# Patient Record
Sex: Female | Born: 1980 | Race: Asian | Hispanic: No | Marital: Married | Smoking: Never smoker
Health system: Southern US, Community
[De-identification: ages and names within clinical notes are randomized; demographics above are authoritative.]

## PROBLEM LIST (undated history)

## (undated) DIAGNOSIS — M51369 Other intervertebral disc degeneration, lumbar region without mention of lumbar back pain or lower extremity pain: Secondary | ICD-10-CM

## (undated) DIAGNOSIS — M5136 Other intervertebral disc degeneration, lumbar region: Secondary | ICD-10-CM

## (undated) DIAGNOSIS — Z8759 Personal history of other complications of pregnancy, childbirth and the puerperium: Secondary | ICD-10-CM

## (undated) HISTORY — PX: DILATION AND CURETTAGE OF UTERUS: SHX78

---

## 2008-11-09 ENCOUNTER — Emergency Department (HOSPITAL_COMMUNITY): Admission: EM | Admit: 2008-11-09 | Discharge: 2008-11-09 | Payer: Self-pay | Admitting: Emergency Medicine

## 2010-08-20 IMAGING — CR DG HAND COMPLETE 3+V*L*
4 series · 4 of 4 positions shown · non-contrast
Comparison: None available.

CLINICAL DATA: Left thumb smashed in door today.

LEFT HAND - COMPLETE 3+ VIEW

[x hand ap left]
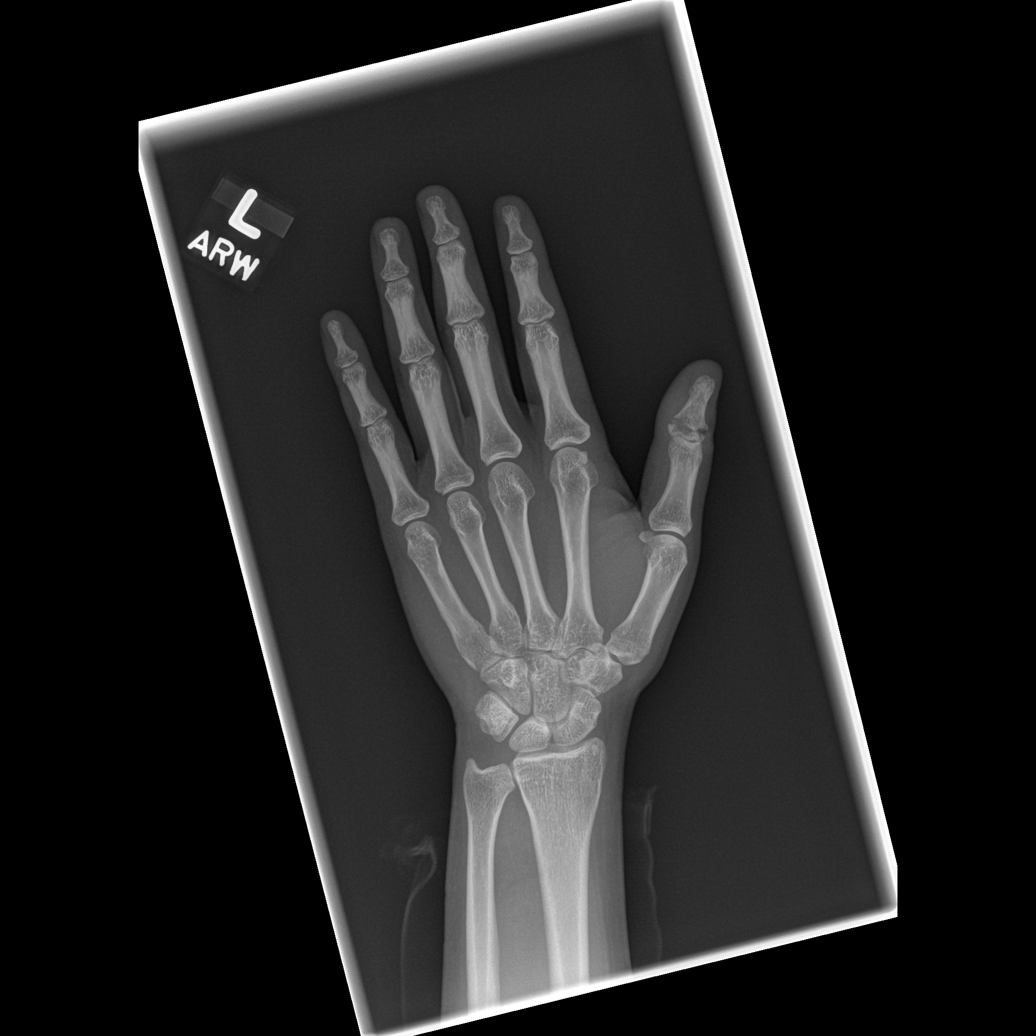

[x hand oblique left]
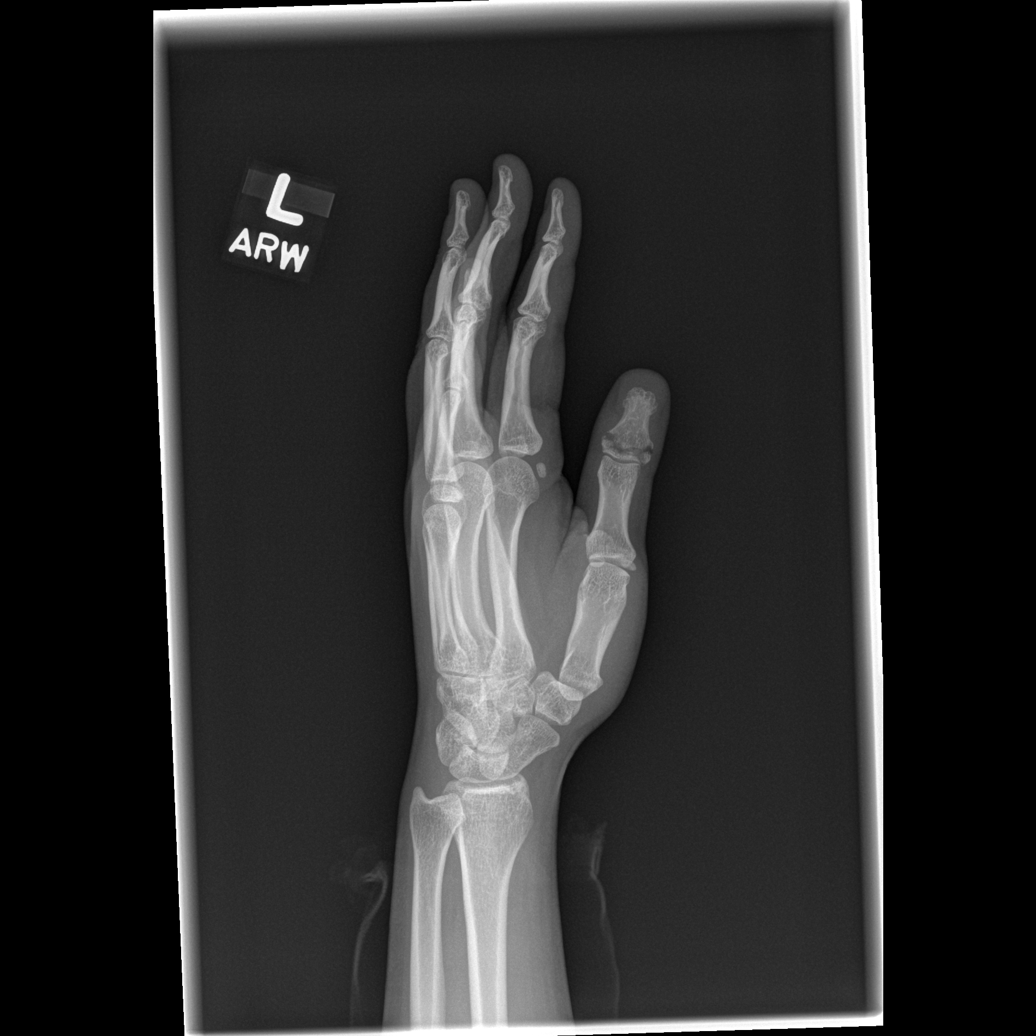

[x hand lat left (1 of 2)]
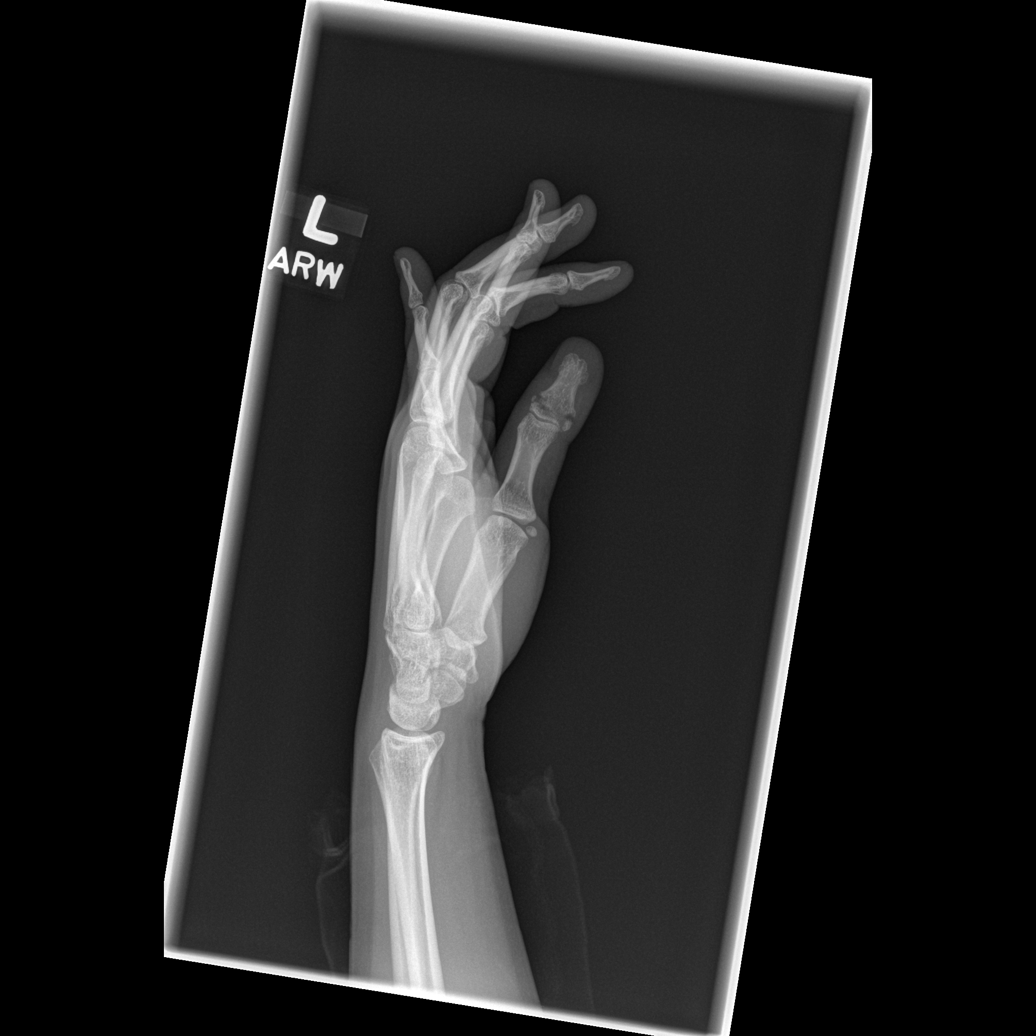

[x hand lat left (2 of 2)]
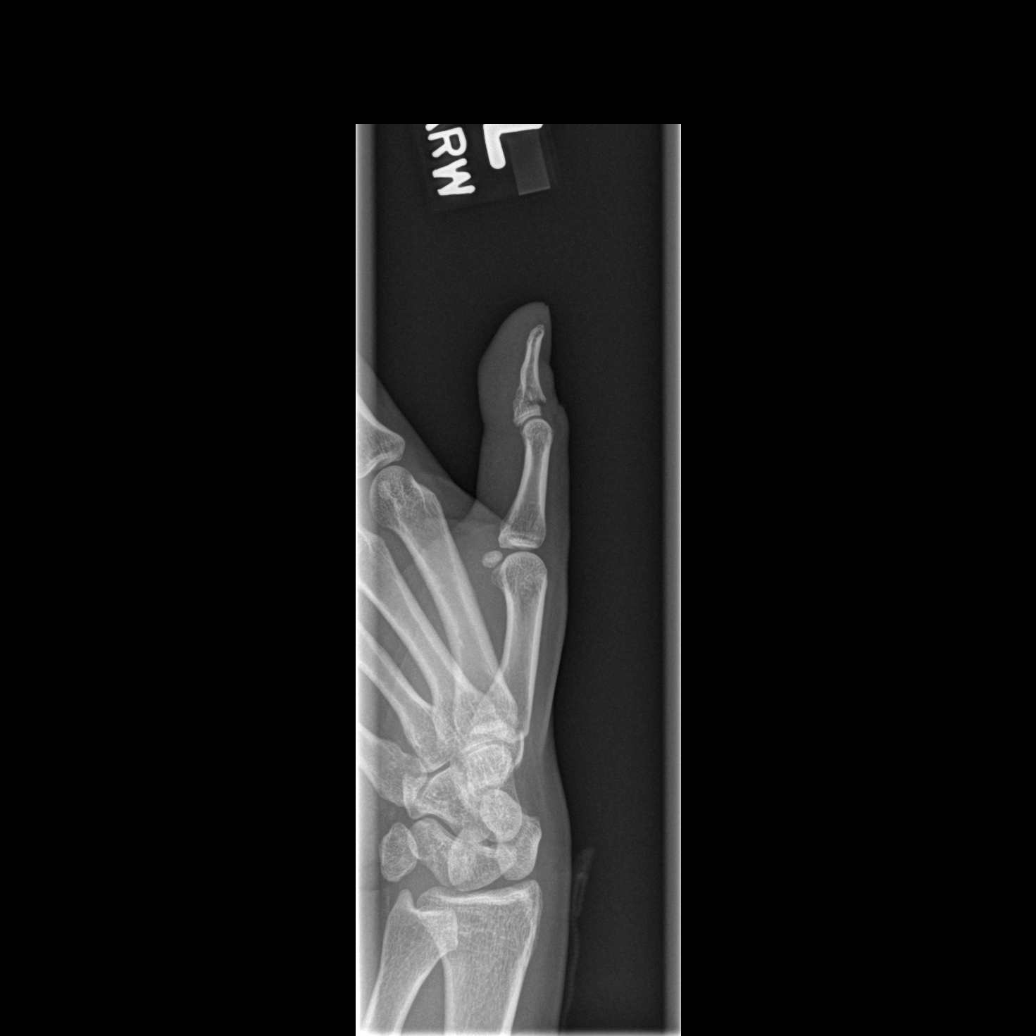

[4 of 4 positions shown; findings below may reference images not displayed]

FINDINGS: There is a comminuted fracture of the base of the distal
phalanx in the right.  This appears to extend to the joint surface
along the radial aspect of the film.  The distal fracture fragment
is displaced posteriorly by just over 1 mm.  There is marked soft
tissue swelling associated.   A soft tissue laceration is evident.
IMPRESSION: 1.  Comminuted fracture at the base of the distal phalanx of the
thumb is displaced posteriorly by approximately 1 mm.
2.  Marked soft tissue swelling and dorsal laceration without
radiopaque foreign body.

## 2012-03-28 ENCOUNTER — Ambulatory Visit: Payer: Self-pay | Admitting: Family Medicine

## 2015-02-07 NOTE — L&D Delivery Note (Signed)
Operative Delivery Note At 3:09 PM a viable and healthy female was delivered via Vaginal, Vacuum Investment banker, operational(Extractor).  Presentation: vertex; Position: Left,, Occiput,, Anterior; Station: +3.  Verbal consent: obtained from family.  Risks and benefits discussed in detail.  Risks include, but are not limited to the risks of anesthesia, bleeding, infection, damage to maternal tissues, fetal cephalhematoma.  There is also the risk of inability to effect vaginal delivery of the head, or shoulder dystocia that cannot be resolved by established maneuvers, leading to the need for emergency cesarean section. Non reassuring FHT noted.   APGAR: 3, 7; weight 6 lb 0.3 oz (2730 g).   Placenta status: spontaneous, intact.   Cord:  with the following complications: ? Short cord.  Cord pH: pending  Anesthesia:  epidural Instruments: kiwi x 2 pulls, one pop off Episiotomy: Median Lacerations:  none Suture Repair: 2.0 vicryl rapide 0 vicryl Est. Blood Loss (mL): 150  Mom to postpartum.  Baby to Couplet care / Skin to Skin.  Sandy Gilmore J 11/08/2015, 5:26 PM

## 2015-04-08 LAB — OB RESULTS CONSOLE GBS: STREP GROUP B AG: NEGATIVE

## 2015-04-22 LAB — OB RESULTS CONSOLE RPR: RPR: NONREACTIVE

## 2015-04-22 LAB — OB RESULTS CONSOLE ABO/RH: RH Type: POSITIVE

## 2015-04-22 LAB — OB RESULTS CONSOLE RUBELLA ANTIBODY, IGM: Rubella: IMMUNE

## 2015-04-22 LAB — OB RESULTS CONSOLE HIV ANTIBODY (ROUTINE TESTING): HIV: NONREACTIVE

## 2015-04-22 LAB — OB RESULTS CONSOLE HEPATITIS B SURFACE ANTIGEN: Hepatitis B Surface Ag: NEGATIVE

## 2015-05-10 LAB — OB RESULTS CONSOLE GC/CHLAMYDIA
Chlamydia: NEGATIVE
Gonorrhea: NEGATIVE

## 2015-11-08 ENCOUNTER — Inpatient Hospital Stay (HOSPITAL_COMMUNITY): Payer: BLUE CROSS/BLUE SHIELD | Admitting: Anesthesiology

## 2015-11-08 ENCOUNTER — Inpatient Hospital Stay (HOSPITAL_COMMUNITY)
Admission: AD | Admit: 2015-11-08 | Discharge: 2015-11-10 | DRG: 775 | Disposition: A | Discharge status: Home / Self Care | Payer: BLUE CROSS/BLUE SHIELD | Source: Ambulatory Visit | Attending: Obstetrics and Gynecology | Admitting: Obstetrics and Gynecology

## 2015-11-08 ENCOUNTER — Encounter (HOSPITAL_COMMUNITY): Payer: Self-pay

## 2015-11-08 DIAGNOSIS — Z3A37 37 weeks gestation of pregnancy: Secondary | ICD-10-CM

## 2015-11-08 DIAGNOSIS — Z3403 Encounter for supervision of normal first pregnancy, third trimester: Secondary | ICD-10-CM | POA: Diagnosis present

## 2015-11-08 DIAGNOSIS — Z8759 Personal history of other complications of pregnancy, childbirth and the puerperium: Secondary | ICD-10-CM

## 2015-11-08 HISTORY — DX: Personal history of other complications of pregnancy, childbirth and the puerperium: Z87.59

## 2015-11-08 HISTORY — DX: Other intervertebral disc degeneration, lumbar region without mention of lumbar back pain or lower extremity pain: M51.369

## 2015-11-08 HISTORY — DX: Other intervertebral disc degeneration, lumbar region: M51.36

## 2015-11-08 LAB — CBC
HCT: 40.9 % (ref 36.0–46.0)
Hemoglobin: 14.4 g/dL (ref 12.0–15.0)
MCH: 33.3 pg (ref 26.0–34.0)
MCHC: 35.2 g/dL (ref 30.0–36.0)
MCV: 94.5 fL (ref 78.0–100.0)
PLATELETS: 182 10*3/uL (ref 150–400)
RBC: 4.33 MIL/uL (ref 3.87–5.11)
RDW: 13.4 % (ref 11.5–15.5)
WBC: 12.1 10*3/uL — ABNORMAL HIGH (ref 4.0–10.5)

## 2015-11-08 LAB — TYPE AND SCREEN
ABO/RH(D): B POS
Antibody Screen: NEGATIVE

## 2015-11-08 LAB — ABO/RH: ABO/RH(D): B POS

## 2015-11-08 MED ORDER — SENNOSIDES-DOCUSATE SODIUM 8.6-50 MG PO TABS
2.0000 | ORAL_TABLET | ORAL | Status: DC
  Administered 2015-11-08 – 2015-11-10 (×2): 2 via ORAL
  Filled 2015-11-08 (×2): qty 2

## 2015-11-08 MED ORDER — OXYTOCIN BOLUS FROM INFUSION
500.0000 mL | Freq: Once | INTRAVENOUS | Status: AC
  Administered 2015-11-08: 500 mL via INTRAVENOUS

## 2015-11-08 MED ORDER — LIDOCAINE HCL (PF) 1 % IJ SOLN
INTRAMUSCULAR | Status: DC | PRN
  Administered 2015-11-08 (×2): 5 mL via EPIDURAL

## 2015-11-08 MED ORDER — OXYCODONE-ACETAMINOPHEN 5-325 MG PO TABS: 2.0000 | ORAL_TABLET | ORAL | Status: DC | PRN

## 2015-11-08 MED ORDER — ZOLPIDEM TARTRATE 5 MG PO TABS
5.0000 mg | ORAL_TABLET | Freq: Every evening | ORAL | Status: DC | PRN
Start: 2015-11-08 — End: 2015-11-10

## 2015-11-08 MED ORDER — PHENYLEPHRINE 40 MCG/ML (10ML) SYRINGE FOR IV PUSH (FOR BLOOD PRESSURE SUPPORT)
80.0000 ug | PREFILLED_SYRINGE | INTRAVENOUS | Status: DC | PRN
  Administered 2015-11-08: 80 ug via INTRAVENOUS

## 2015-11-08 MED ORDER — COCONUT OIL OIL
1.0000 "application " | TOPICAL_OIL | Status: DC | PRN
  Administered 2015-11-09: 1 via TOPICAL
  Filled 2015-11-08: qty 120

## 2015-11-08 MED ORDER — LACTATED RINGERS IV SOLN
INTRAVENOUS | Status: DC
  Administered 2015-11-08 (×2): via INTRAVENOUS

## 2015-11-08 MED ORDER — ONDANSETRON HCL 4 MG PO TABS: 4.0000 mg | ORAL_TABLET | ORAL | Status: DC | PRN

## 2015-11-08 MED ORDER — DIBUCAINE 1 % RE OINT: 1.0000 "application " | TOPICAL_OINTMENT | RECTAL | Status: DC | PRN

## 2015-11-08 MED ORDER — TETANUS-DIPHTH-ACELL PERTUSSIS 5-2.5-18.5 LF-MCG/0.5 IM SUSP: 0.5000 mL | Freq: Once | INTRAMUSCULAR | Status: DC

## 2015-11-08 MED ORDER — EPHEDRINE 5 MG/ML INJ: 10.0000 mg | INTRAVENOUS | Status: DC | PRN

## 2015-11-08 MED ORDER — ACETAMINOPHEN 325 MG PO TABS
650.0000 mg | ORAL_TABLET | ORAL | Status: DC | PRN
  Administered 2015-11-09: 650 mg via ORAL
  Filled 2015-11-08: qty 2

## 2015-11-08 MED ORDER — ONDANSETRON HCL 4 MG/2ML IJ SOLN: 4.0000 mg | INTRAMUSCULAR | Status: DC | PRN

## 2015-11-08 MED ORDER — WITCH HAZEL-GLYCERIN EX PADS: 1.0000 "application " | MEDICATED_PAD | CUTANEOUS | Status: DC | PRN

## 2015-11-08 MED ORDER — FENTANYL 2.5 MCG/ML BUPIVACAINE 1/10 % EPIDURAL INFUSION (WH - ANES)
14.0000 mL/h | INTRAMUSCULAR | Status: DC | PRN
  Administered 2015-11-08: 14 mL/h via EPIDURAL
  Filled 2015-11-08: qty 125

## 2015-11-08 MED ORDER — LACTATED RINGERS IV SOLN: 500.0000 mL | INTRAVENOUS | Status: DC | PRN

## 2015-11-08 MED ORDER — ONDANSETRON HCL 4 MG/2ML IJ SOLN: 4.0000 mg | Freq: Four times a day (QID) | INTRAMUSCULAR | Status: DC | PRN

## 2015-11-08 MED ORDER — IBUPROFEN 600 MG PO TABS
600.0000 mg | ORAL_TABLET | Freq: Four times a day (QID) | ORAL | Status: DC
  Administered 2015-11-08 – 2015-11-10 (×9): 600 mg via ORAL
  Filled 2015-11-08 (×9): qty 1

## 2015-11-08 MED ORDER — PHENYLEPHRINE 40 MCG/ML (10ML) SYRINGE FOR IV PUSH (FOR BLOOD PRESSURE SUPPORT)
80.0000 ug | PREFILLED_SYRINGE | INTRAVENOUS | Status: DC | PRN
  Filled 2015-11-08: qty 10

## 2015-11-08 MED ORDER — DIPHENHYDRAMINE HCL 50 MG/ML IJ SOLN: 12.5000 mg | INTRAMUSCULAR | Status: DC | PRN

## 2015-11-08 MED ORDER — SOD CITRATE-CITRIC ACID 500-334 MG/5ML PO SOLN: 30.0000 mL | ORAL | Status: DC | PRN

## 2015-11-08 MED ORDER — OXYCODONE-ACETAMINOPHEN 5-325 MG PO TABS: 1.0000 | ORAL_TABLET | ORAL | Status: DC | PRN

## 2015-11-08 MED ORDER — PRENATAL MULTIVITAMIN CH
1.0000 | ORAL_TABLET | Freq: Every day | ORAL | Status: DC
  Administered 2015-11-09 – 2015-11-10 (×2): 1 via ORAL
  Filled 2015-11-08 (×2): qty 1

## 2015-11-08 MED ORDER — LACTATED RINGERS IV SOLN: 500.0000 mL | Freq: Once | INTRAVENOUS | Status: DC

## 2015-11-08 MED ORDER — OXYTOCIN 40 UNITS IN LACTATED RINGERS INFUSION - SIMPLE MED
2.5000 [IU]/h | INTRAVENOUS | Status: DC
  Filled 2015-11-08: qty 1000

## 2015-11-08 MED ORDER — DIPHENHYDRAMINE HCL 25 MG PO CAPS: 25.0000 mg | ORAL_CAPSULE | Freq: Four times a day (QID) | ORAL | Status: DC | PRN

## 2015-11-08 MED ORDER — OXYCODONE-ACETAMINOPHEN 5-325 MG PO TABS
1.0000 | ORAL_TABLET | ORAL | Status: DC | PRN
  Administered 2015-11-09: 1 via ORAL
  Filled 2015-11-08: qty 1

## 2015-11-08 MED ORDER — FLEET ENEMA 7-19 GM/118ML RE ENEM: 1.0000 | ENEMA | RECTAL | Status: DC | PRN

## 2015-11-08 MED ORDER — METHYLERGONOVINE MALEATE 0.2 MG PO TABS: 0.2000 mg | ORAL_TABLET | ORAL | Status: DC | PRN

## 2015-11-08 MED ORDER — LIDOCAINE HCL (PF) 1 % IJ SOLN
30.0000 mL | INTRAMUSCULAR | Status: DC | PRN
  Filled 2015-11-08: qty 30

## 2015-11-08 MED ORDER — BENZOCAINE-MENTHOL 20-0.5 % EX AERO
1.0000 "application " | INHALATION_SPRAY | CUTANEOUS | Status: DC | PRN
  Administered 2015-11-09: 1 via TOPICAL
  Filled 2015-11-08: qty 56

## 2015-11-08 MED ORDER — METHYLERGONOVINE MALEATE 0.2 MG/ML IJ SOLN: 0.2000 mg | INTRAMUSCULAR | Status: DC | PRN

## 2015-11-08 MED ORDER — ACETAMINOPHEN 325 MG PO TABS: 650.0000 mg | ORAL_TABLET | ORAL | Status: DC | PRN

## 2015-11-08 MED ORDER — SIMETHICONE 80 MG PO CHEW: 80.0000 mg | CHEWABLE_TABLET | ORAL | Status: DC | PRN

## 2015-11-08 NOTE — Progress Notes (Signed)
Dr Billy Coasttaavon called and notified of prolonged and late decelerations with laboring down and as well as pushing.  Notified pt feels some pressure with uc's and baby has moved some during laboring down, but minimal movement with pushing.  md will evaluate tracing remotely and will continue pushing

## 2015-11-08 NOTE — Progress Notes (Signed)
   11/08/15 1845  Epidural/Spinal Function  Cutaneous sensation Normal sensation  Patient able to flex knees Yes  Patient able to lift hips off bed Yes  Back pain beyond tenderness at insertion site No  Progressively worsening motor and/or sensory loss No  Bowel and/or bladder incontinence post epidural No  pt states hx of dizziness and fall after previous endoscopy 2 yrs ago. States she has hx of prolonged dizziness after anesthesia. Dizziness when sitting on side of bed, I & O cath with 800 output instead of Stedy to bathroom.

## 2015-11-08 NOTE — Anesthesia Procedure Notes (Signed)
Epidural Patient location during procedure: OB Start time: 11/08/2015 11:20 AM End time: 11/08/2015 11:24 AM  Staffing Anesthesiologist: Leilani AbleHATCHETT, Windel Keziah Performed: anesthesiologist   Preanesthetic Checklist Completed: patient identified, surgical consent, pre-op evaluation, timeout performed, IV checked, risks and benefits discussed and monitors and equipment checked  Epidural Patient position: sitting Prep: site prepped and draped and DuraPrep Patient monitoring: continuous pulse ox and blood pressure Approach: midline Location: L3-L4 Injection technique: LOR air  Needle:  Needle type: Tuohy  Needle gauge: 17 G Needle length: 9 cm and 9 Needle insertion depth: 5 cm (3) cm Catheter type: closed end flexible Catheter size: 19 Gauge Catheter at skin depth: 8 cm Test dose: negative and Other  Assessment Sensory level: T9 Events: blood not aspirated, injection not painful, no injection resistance, negative IV test and no paresthesia  Additional Notes Reason for block:procedure for pain

## 2015-11-08 NOTE — H&P (Signed)
Sandy Gilmore is a 35 y.o. female presenting for labor. History of advanced dilatation. RN notes cervical change.  OB History    Gravida Para Term Preterm AB Living   3 0 0 0 2 0   SAB TAB Ectopic Multiple Live Births   1 1 0 0 0     Past Medical History:  Diagnosis Date  . Degenerative disc disease, lumbar    Past Surgical History:  Procedure Laterality Date  . DILATION AND CURETTAGE OF UTERUS     Family History: family history is not on file. Social History:  reports that she has never smoked. She has never used smokeless tobacco. She reports that she does not drink alcohol or use drugs.     Maternal Diabetes: No Genetic Screening: Normal Maternal Ultrasounds/Referrals: Normal Fetal Ultrasounds or other Referrals:  None Maternal Substance Abuse:  No Significant Maternal Medications:  None Significant Maternal Lab Results:  None Other Comments:  None  Review of Systems  Constitutional: Negative.   All other systems reviewed and are negative.  Maternal Medical History:  Reason for admission: Contractions.   Contractions: Onset was less than 1 hour ago.   Perceived severity is moderate.    Fetal activity: Perceived fetal activity is normal.   Last perceived fetal movement was within the past hour.    Prenatal complications: no prenatal complications Prenatal Complications - Diabetes: none.    Dilation: 5.5 Effacement (%): 100 Station: -1 Exam by:: Sandy Oldee Carter RN Blood pressure 123/77, pulse 73, temperature 98 F (36.7 C), temperature source Oral, resp. rate 18, height 4\' 11"  (1.499 m), weight 51.3 kg (113 lb), SpO2 100 %. Maternal Exam:  Uterine Assessment: Contraction strength is moderate.  Contraction frequency is regular.   Abdomen: Patient reports no abdominal tenderness. Fetal presentation: vertex  Introitus: Normal vulva. Normal vagina.  Ferning test: not done.  Nitrazine test: not done. Amniotic fluid character: not assessed.  Pelvis: adequate for  delivery.   Cervix: Cervix evaluated by digital exam.     Physical Exam  Nursing note and vitals reviewed. Constitutional: She is oriented to person, place, and time.  HENT:  Head: Normocephalic and atraumatic.  Neck: Normal range of motion. Neck supple.  Cardiovascular: Normal rate and regular rhythm.   Respiratory: Effort normal and breath sounds normal.  GI: Soft. Bowel sounds are normal.  Genitourinary: Vagina normal and uterus normal. Rectal exam shows guaiac negative stool.  Musculoskeletal: Normal range of motion.  Neurological: She is alert and oriented to person, place, and time. She has normal reflexes.  Skin: Skin is warm and dry.  Psychiatric: She has a normal mood and affect.    Prenatal labs: ABO, Rh: B/Positive/-- (03/16 0000) Antibody:   Rubella: Immune (03/16 0000) RPR: Nonreactive (03/16 0000)  HBsAg: Negative (03/16 0000)  HIV: Non-reactive (03/16 0000)  GBS: Negative (03/02 0000)   Assessment/Plan: Term IUP Active labor   Sandy Gilmore 11/08/2015, 10:32 AM

## 2015-11-08 NOTE — Anesthesia Preprocedure Evaluation (Signed)

## 2015-11-08 NOTE — Progress Notes (Signed)
Carroll SageQian Langhorne is a 35 y.o. G3P0020 at 3723w0d by LMP admitted for active labor  Subjective: More comfortable.  Objective: BP (!) 118/103   Pulse (!) 102   Temp 98 F (36.7 C) (Oral)   Resp 16   Ht 4\' 11"  (1.499 m)   Wt 51.3 kg (113 lb)   SpO2 100%   BMI 22.82 kg/m  No intake/output data recorded. No intake/output data recorded.  FHT:  FHR: 115 bpm, variability: moderate,  accelerations:  Present,  decelerations:  Absent and   UC:   regular, every 3 minutes SVE:   7-8/100/+1  Labs: Lab Results  Component Value Date   WBC 12.1 (H) 11/08/2015   HGB 14.4 11/08/2015   HCT 40.9 11/08/2015   MCV 94.5 11/08/2015   PLT 182 11/08/2015    Assessment / Plan: Spontaneous labor, progressing normally  Labor: Progressing normally Preeclampsia:  no signs or symptoms of toxicity Fetal Wellbeing:  Category I Pain Control:  Epidural I/D:  n/a Anticipated MOD:  NSVD  Alethia Melendrez J 11/08/2015, 11:58 AM

## 2015-11-08 NOTE — Progress Notes (Signed)
Dr Billy Coasttaavon called and notified of prolonged decels with pushing.  Orders rec to stop pushing and allow baby to recover at this time.  Will resume pushing in 1 hour or sooner if needed.

## 2015-11-08 NOTE — Lactation Note (Signed)
This note was copied from a baby's chart. Lactation Consultation Note  Patient Name: Boy Carroll SageQian Troutman ZOXWR'UToday's Date: 11/08/2015 Reason for consult: Initial assessment Baby at 6 hr of life. Mom is worried because baby is sleepy. She stated that her mother did not have enough milk so she does not think she will. Discussed baby behavior, feeding frequency, baby belly size, voids, wt loss, breast changes, and nipple care. Demonstrated manual expression, colostrum noted bilaterally, spoon in room. Given lactation handouts. Aware of OP services and support group. Baby was sleeping in basinet with clear bubble on his lips, no attempt to latch at this visit. Mom will call for help at next feeding.     Maternal Data Has patient been taught Hand Expression?: Yes Does the patient have breastfeeding experience prior to this delivery?: No  Feeding Feeding Type: Breast Fed Length of feed: 3 min  LATCH Score/Interventions                      Lactation Tools Discussed/Used WIC Program: No   Consult Status Consult Status: Follow-up Date: 11/09/15 Follow-up type: In-patient    Rulon Eisenmengerlizabeth E Coolidge Gossard 11/08/2015, 10:06 PM

## 2015-11-08 NOTE — Anesthesia Postprocedure Evaluation (Signed)
Anesthesia Post Note  Patient: Sandy Gilmore  Procedure(s) Performed: * No procedures listed *  Patient location during evaluation: Mother Baby Anesthesia Type: Epidural Level of consciousness: awake Pain management: satisfactory to patient Vital Signs Assessment: post-procedure vital signs reviewed and stable Respiratory status: spontaneous breathing Cardiovascular status: stable Anesthetic complications: no     Last Vitals:  Vitals:   11/08/15 1730 11/08/15 1845  BP: 105/67 99/64  Pulse: 82 67  Resp: 18 19  Temp: 36.5 C 36.8 C    Last Pain:  Vitals:   11/08/15 1845  TempSrc:   PainSc: 0-No pain   Pain Goal:                 KeyCorpBURGER,Emelina Hinch

## 2015-11-08 NOTE — Anesthesia Rounding Note (Signed)
  CRNA Epidural Rounding Note  Patient: Carroll SageQian Carolan, 35 y.o., female  Patient's current pain level: Pain Score: 5  (11/08/15 1210)  Agreed upon pain management level: Unable to assess.   Epidural intervention: none  Comments: Patient pushing. Per RN adequate epidural level.  [facility] LLCEIGHT,Kenneith Stief 11/08/2015

## 2015-11-08 NOTE — MAU Note (Signed)
Pt reports regular contractions, was 4/100 last week.

## 2015-11-09 LAB — CBC
HCT: 31.1 % — ABNORMAL LOW (ref 36.0–46.0)
Hemoglobin: 10.8 g/dL — ABNORMAL LOW (ref 12.0–15.0)
MCH: 32.9 pg (ref 26.0–34.0)
MCHC: 34.7 g/dL (ref 30.0–36.0)
MCV: 94.8 fL (ref 78.0–100.0)
Platelets: 135 10*3/uL — ABNORMAL LOW (ref 150–400)
RBC: 3.28 MIL/uL — AB (ref 3.87–5.11)
RDW: 13.7 % (ref 11.5–15.5)
WBC: 17.1 10*3/uL — AB (ref 4.0–10.5)

## 2015-11-09 LAB — RPR: RPR Ser Ql: NONREACTIVE

## 2015-11-09 NOTE — Lactation Note (Signed)
This note was copied from a baby's chart. Lactation Consultation Note Baby now 2621 hours old and has had one good feeding this morning at 10:15/ Mom reports he fed for 15 min, the best he has done. Baby has been sleepy at the breast. Attempted to latch baby but he was too sleepy at this time. Discussed supplementing with EBM or formula and mom agreeable. Has late preterm guidelines for amounts to supplement. Reviewed supplementation methods and mom wants to try finger feeding with a syringe. Reviewed with mom. Mom supplemented with 7 ml of Alimentum with my assist. Baby roused up and we attempted to latch him to breast. Took a few sucks then off to sleep. Reviewed feeding cues and encouraged to feed whenever she sees them or at least q 2-3 hours. Encouraged to breast feed first then supplement and then pump to promote milk supply. Mom agreeable to this plan. Has Spectra pump for home. Mom to pump when dad gets back from lunch. Can supplement at next feeding with any Colostrum obtained. No questions at present. To call for assist prn    Patient Name: Sandy Gilmore ZOXWR'UToday's Date: 11/09/2015 Reason for consult: Follow-up assessment (37.0 Sandy Gilmore)   Maternal Data Formula Feeding for Exclusion: No Has patient been taught Hand Expression?: Yes Does the patient have breastfeeding experience prior to this delivery?: No  Feeding Feeding Type: Breast Fed Length of feed: 15 min  LATCH Score/Interventions Latch: Too sleepy or reluctant, no latch achieved, no sucking elicited.  Audible Swallowing: None  Type of Nipple: Everted at rest and after stimulation  Comfort (Breast/Nipple): Soft / non-tender     Hold (Positioning): Assistance needed to correctly position infant at breast and maintain latch. Intervention(s): Breastfeeding basics reviewed;Position options  LATCH Score: 5  Lactation Tools Discussed/Used WIC Program: No Pump Review: Setup, frequency, and cleaning Initiated by:: Sandy Gilmore Date  initiated::  (reviewed late term infant feeding policy, approved by MD)   Consult Status Consult Status: Follow-up Date: 11/10/15 Follow-up type: In-patient    Sandy Gilmore, Sandy Gilmore 11/09/2015, 12:50 PM

## 2015-11-09 NOTE — Progress Notes (Signed)
PPD # 1 SVD Information for the patient's newborn:  Sandy Gilmore, Sandy Gilmore [[id number]][[id number]]  female    breast feeding , baby slow to latch, has tight jaw, biting down, needs posterior tongue stim to elicit sucking reflex./ Circumcision undecided   S:  Reports feeling well             Tolerating po/ No nausea or vomiting             Bleeding is light             Pain controlled with PO meds             Up ad lib / ambulatory / voiding without difficulties        O:  A & O x 3, in no apparent distress              VS:  Vitals:   11/08/15 1730 11/08/15 1845 11/08/15 2216 11/09/15 0610  BP: 105/67 99/64 (!) 98/59 (!) 83/47  Pulse: 82 67 68 (!) 56  Resp: 18 19 18 16   Temp: 97.7 F (36.5 C) 98.3 F (36.8 C) 97.5 F (36.4 C) 97.8 F (36.6 C)  TempSrc:   Oral Oral  SpO2:   100%   Weight:      Height:        LABS:  Recent Labs  11/08/15 1010 11/09/15 0539  WBC 12.1* 17.1*  HGB 14.4 10.8*  HCT 40.9 31.1*  PLT 182 135*    Blood type: --/--/B POS, B POS (10/02 1010)  Rubella: Immune (03/16 0000)   I&O: I/O last 3 completed shifts: In: -  Out: 1750 [Urine:1600; Blood:150]          No intake/output data recorded.  Lungs: Clear and unlabored  Heart: regular rate and rhythm / no murmurs  Abdomen: soft, non-tender, non-distended             Fundus: firm, non-tender, U-1  Perineum: repair intact  Lochia: small  Extremities: no edema, no calf pain or tenderness    A/P: PPD # 1 35 y.o., [id number]   Principal Problem:   Status post vacuum-assisted vaginal delivery (10/2)   Doing well - stable status  Routine post partum orders  LC for latch assist  Anticipate discharge tomorrow    [name], MSN, CNM 11/09/2015, 10:00 AM

## 2015-11-10 ENCOUNTER — Ambulatory Visit: Payer: Self-pay

## 2015-11-10 MED ORDER — IBUPROFEN 600 MG PO TABS: 600.0000 mg | ORAL_TABLET | Freq: Four times a day (QID) | ORAL | 0 refills | Status: AC

## 2015-11-10 MED ORDER — OXYCODONE-ACETAMINOPHEN 5-325 MG PO TABS: 1.0000 | ORAL_TABLET | ORAL | 0 refills | Status: DC | PRN

## 2015-11-10 NOTE — Lactation Note (Signed)
This note was copied from a baby's chart. Lactation Consultation Note  Patient Name: Sandy Gilmore GNFAO'ZToday's Date: 11/10/2015 Reason for consult: Follow-up assessment Baby at 53 hr of life. RN requested lactation to help mom with 5 Fr feeding because mom can not push the formula while the baby is latched. Mom recognized lactation from the visit after birth and became angry. Mom stated that lactation told her to let the baby sleep and did not give her a "green sheet". She reports that baby has jaundice because of lactation giving the wrong information. Then she loudly stated that lactation was not pushing the plunger correctly. She stated that lactation was hurting her baby. The mother's RN was busy with another patient so a different RN was brought in to help mom finish the feeding.    Maternal Data    Feeding    LATCH Score/Interventions                      Lactation Tools Discussed/Used     Consult Status Consult Status: Follow-up Date: 11/11/15 Follow-up type: In-patient    Sandy Gilmore 11/10/2015, 9:00 PM

## 2015-11-10 NOTE — Lactation Note (Addendum)
This note was copied from a baby's chart. Lactation Consultation Note  Sandy Gilmore is being supplemented at the breast with a syringe SNS and at times is being finger fed.  Baby did not suckle well on a gloved finger and a frenum was felt under the tongue with a finger sweep. Sandy Gilmore was observed BF and did not open his mouth wide. He does not have a wide gape, is chewing at the breast and mother reports pain.  FOB is pushing the supplement into Sandy Gilmore's mouth. He did not have a good suck swallow pattern on the first breast.  Attempted finger feeding and when once the finger was worked to the hard and soft palate juncture he suckled better.  He was positioned on the right breast and mother worked her nipple deeper into his mouth which stimulated him to suckle better though dad continued to assist with transfer from the SNS.  Reviewed positioning with parents.  Syringe changed from a 35 ml to a 12 ml to make transfer easier.  Ped notified of frenum and difficulty with wide gape.  She will assess for tongue tie.  Also suggested to pediatrician that baby may benefit from delayed circumcision. Plan is to BF with SNS and post-pumping for mother. Formula instructions given to parents. IBCLC follow-up as needed today. Patient Name: Sandy Gilmore IONGE'XToday's Date: 11/10/2015 Reason for consult: Follow-up assessment   Maternal Data    Feeding Feeding Type: Formula Length of feed: 45 min  LATCH Score/Interventions Latch: Repeated attempts needed to sustain latch, nipple held in mouth throughout feeding, stimulation needed to elicit sucking reflex.  Audible Swallowing: A few with stimulation  Type of Nipple: Everted at rest and after stimulation  Comfort (Breast/Nipple): Filling, red/small blisters or bruises, mild/mod discomfort     Hold (Positioning): No assistance needed to correctly position infant at breast.  LATCH Score: 7  Lactation Tools Discussed/Used Tools: 80F feeding tube / Syringe   Consult  Status Consult Status: Follow-up Date: 11/11/15 Follow-up type: In-patient    Sandy Gilmore, Sandy Gilmore 11/10/2015, 11:09 AM

## 2015-11-10 NOTE — Progress Notes (Signed)
PPD 2 SVD  S:  Reports feeling well             Tolerating po/ No nausea or vomiting             Bleeding is light             Pain controlled with motrin and percocet             Up ad lib / ambulatory / voiding QS  Newborn breast feeding - elevated bilirubin on lights  O:               VS: BP (!) 94/55 (BP Location: Right Arm)   Pulse 64   Temp 98 F (36.7 C) (Oral)   Resp 16   Ht 4\' 11"  (1.499 m)   Wt 51.3 kg (113 lb)   SpO2 100%   Breastfeeding? Unknown   BMI 22.82 kg/m    LABS:              Recent Labs  11/08/15 1010 11/09/15 0539  WBC 12.1* 17.1*  HGB 14.4 10.8*  PLT 182 135*               Blood type: --/--/B POS, B POS (10/02 1010)  Rubella: Immune (03/16 0000)                     I&O: Intake/Output      10/03 0701 - 10/04 0700 10/04 0701 - 10/05 0700   Urine (mL/kg/hr)     Blood     Total Output       Net                          Physical Exam:             Alert and oriented X3  Lungs: Clear and unlabored  Heart: regular rate and rhythm / no mumurs  Abdomen: soft, non-tender, non-distended              Fundus: firm, non-tender, Ueven  Perineum: no edema  Lochia: light  Extremities: no edema, no calf pain or tenderness    A: PPD # 2   Doing well - stable status  P: Routine post partum orders  DC home  Marlinda MikeBAILEY, Jazlynn Nemetz CNM, MSN, [facility] 11/10/2015, 4:06 PM

## 2015-11-10 NOTE — Discharge Summary (Signed)
Obstetric Discharge Summary  Reason for Admission: onset of labor Prenatal Procedures: none Intrapartum Procedures: spontaneous vaginal delivery Postpartum Procedures: none Complications-Operative and Postpartum: repair of median episiotomy Hemoglobin  Date Value Ref Range Status  11/09/2015 10.8 (L) 12.0 - 15.0 g/dL Final    Comment:    DELTA CHECK NOTED REPEATED TO VERIFY    HCT  Date Value Ref Range Status  11/09/2015 31.1 (L) 36.0 - 46.0 % Final    Physical Exam:  General: alert, cooperative and no distress Lochia: appropriate Uterine Fundus: firm Incision: healing well DVT Evaluation: No evidence of DVT seen on physical exam.  Discharge Diagnoses: Term Pregnancy-delivered  Discharge Information: Date: 11/10/2015 Activity: pelvic rest Diet: routine Medications: PNV, Ibuprofen and Percocet Condition: stable Instructions: refer to practice specific booklet Discharge to: home Follow-up Information    Lenoard AdenAAVON,[name], MD. Schedule an appointment as soon as possible for a visit in 6 week(s).   Specialty:  Obstetrics and Gynecology Contact information: [address] [place] [address] [phone]           Newborn Data: Live born female  Birth Weight: 6 lb 0.3 oz (2730 g) APGAR: 3, 7  Home with mother.  Sandy Gilmore 11/10/2015, 4:08 PM

## 2015-11-11 ENCOUNTER — Ambulatory Visit: Payer: Self-pay

## 2015-11-11 NOTE — Clinical Social Work Maternal (Signed)
CLINICAL SOCIAL WORK MATERNAL/CHILD NOTE  Patient Details  Name: Sandy Gilmore MRN: [id number] Date of Birth: 06-Jan-1981  Date:  11/11/2015  Clinical Social Worker Initiating Note:  Laurey Arrow Date/ Time Initiated:  11/11/15/1017     Child's Name:  Delton [place]   Legal Guardian:  Father   Need for Interpreter:  None   Date of Referral:  11/11/15     Reason for Referral:  Behavioral Health Issues, including SI    Referral Source:  [facility]   Address:  [place] [address]  Phone number:  [phone]   Household Members:  Self, Spouse, Parents   Natural Supports (not living in the home):  Immediate Family, Spouse/significant other, Parent, Medical laboratory scientific officer, Friends   Chiropodist: None   Employment: Animator   Type of Work: Professor at [employer]:  Geophysical data processor Resources:  Multimedia programmer   Other Resources:      Cultural/Religious Considerations Which May Impact Care:  None Reported.  Strengths:  Ability to meet basic needs , Pediatrician chosen , Home prepared for child    Risk Factors/Current Problems:  Mental Health Concerns    Cognitive State:  Alert , Linear Thinking , Insightful , Able to Concentrate   Mood/Affect:  Anxious , Sad , Comfortable , Interested    CSW Assessment: CSW met with MOB to complete an assessment for possible PPD signs and symptoms. When CSW entered the room FOB was attentive to infant, and MOB immediately began to cry.  CSW inquired about MOB's tears and MOB had difficulty verbalizing why she was crying. CSW got permission to meet with MOB while FOB was present. FOB took the initiative and informed CSW that MOB was feeling overwhelmed about breastfeeding.  After FOB updated CSW, MOB was able to gain her composure and explain to CSW that MOB has being feeling overwhelmed and anxious regarding breastfeeding.  CSW informed MOB that CSW can request lactation to assist MOB and  MOB communicated that a lactation consultant is the reason why MOB initially felt overwhelmed.  MOB communicated that hospital staff has been supportive and helpful; however, there is one Science writer that has been insensitive. MOB explained to CSW that MOB had a bad experience with lactation and at this time was not interested in meeting with lactation again at this time.  CSW validated MOB's thoughts and feelings and encouraged the family to continue to try to breastfeed if that is what they desired. Throughout the session, MOB's mood improved and CSW was able to observed MOB and FOB breastfeeding that baby with SNS.  Infant and parents looked comfortable and feeding was successful.  CSW apologized on behalf of lactation and hospital for MOB feeling like staff was insensitive.  CSW also educated MOB about PPD. CSW informed MOB of possible supports and interventions to decrease PPD.  CSW also encouraged MOB to seek medical attention if needed for increased signs and symptoms for PPD.   MOB was insightful and appeared knowledgeable about PPD as evidence by her appropriate responses to CSW questions.  MOB declined resources and referrals for behavioral health counseling, however she communicated she will reach at to [place] or MOB's OBGYN if she felt counseling was necessary. CSW provided MOB with a flyer regarding supports group offered by the hospital. MOB did not have any further questions, concerns, or needs at this time.  CSW thanked MOB and FOB for meeting with CSW.  CSW Plan/Description:  Engineer, mining ,  No Further Intervention Required/No Barriers to Discharge, Information/Referral to [facility], MSW, [facility] Clinical Social Work [phone]   Dimple Nanas, LCSW 11/11/2015, 10:21 AM

## 2015-11-11 NOTE — Lactation Note (Signed)
This note was copied from a baby's chart. Lactation Consultation Note  Patient Name: Sandy Gilmore ZOXWR'UToday's Date: 11/11/2015 Reason for consult: Follow-up assessment;Late preterm infant;Infant < 6lbs;Hyperbilirubinemia   Returned to give mom and dad written LC plan and OP appt. Parents have decided to stay another night and have infant circumcised in the morning. Discussed with mom that Clydene Pughsher may be sleepy after circumcision and to still continue to awaken him for feedings.   Written plan given as follows: Breastfeed infant every 3 hours using 5 fr feeding tube at breast with EBM or Alimentum Continue to increase supplementation amounts per LPT infant policy Pump both breasts with DEBP for 15 -20 minutes post BF Hand express both breasts While mom is pumping dad can finger feed infant if he does not finish supplement at the breast Keep up the great work Rest when Clydene P[facility]hsher is [facility] at 4 pm, appt reminder given.   Follow up tomorrow and prn, Report to Irving BurtonEmily, [place].    Maternal Data Formula Feeding for Exclusion: No Has patient been taught Hand Expression?: Yes Does the patient have breastfeeding experience prior to this delivery?: No  Feeding Feeding Type: Breast Fed Length of feed: 20 min  LATCH Score/Interventions Latch: Repeated attempts needed to sustain latch, nipple held in mouth throughout feeding, stimulation needed to elicit sucking reflex. Intervention(s): Skin to skin;Teach feeding cues;Waking techniques Intervention(s): Breast compression;Breast massage;Assist with latch;Adjust position  Audible Swallowing: Spontaneous and intermittent (with 5 french feeding tube and formula) Intervention(s): Skin to skin;Hand expression  Type of Nipple: Everted at rest and after stimulation  Comfort (Breast/Nipple): Soft / non-tender     Hold (Positioning): Assistance needed to correctly position infant at breast and maintain  latch. Intervention(s): Breastfeeding basics reviewed;Support Pillows;Position options;Skin to skin  LATCH Score: 8  Lactation Tools Discussed/Used Tools: 61F feeding tube / Syringe Pump Review: Setup, frequency, and cleaning;Milk Storage Initiated by:: Reviewed   Consult Status Consult Status: Follow-up Date: 11/12/15 Follow-up type: In-patient    Silas FloodSharon S Edi Gorniak 11/11/2015, 3:31 PM

## 2015-11-11 NOTE — Lactation Note (Signed)
This note was copied from a baby's chart. Lactation Consultation Note  Patient Name: Boy Carroll SageQian Ackerman ZOXWR'UToday's Date: 11/11/2015 Reason for consult: Follow-up assessment  [facility] . LC reviewed the previous LC's plan.  And answered mom and dad's questions and concerns regarding baby's consistency at the  Breast and finger feeding. It had only been 2  1/2 hours since the baby last fed and the baby wasn't showing any hunger  Cues. LC stressed the importance since it had only been 2 1/2 hours since baby fed and he was asleep to wait until 3 hours.  @ 7 pm , baby started waking up. LC noted mom to be borderline engorged bilaterally , LC instructed mom to ice left breast ,  and LC could hand express some milk out of the right , so LC assisted to latch the baby in football position. Used the SNS for 3 ml  And baby kept coming off , so LC re-latched the baby with depth and baby fed for 20 mins and  Sustained latch . Per mom comfortable  And softened the breast some , not entirely. While baby was eating , hooked up the DEBP for the other breast to enhance let down on the  Right side ( baby was latched) , softened more . After baby fell asleep . LC had mom pump both breast for 20 mins total , with some relief .  Mom is exhausted. And LC assisted her to lie down and be iced lateral aspects of both breast and on top with pillow support.  LC assisted dad to finger feed 3 ml of EBM and then dad finger fed formula 24 ml from 53F feeding tube/ baby tolerated well.  Mom aware the importance  Of a good nap for let down , ice and pumping.  LC even suggested to enhance  the rest for mom to feed 1st breast 20 mins , supplement afterwards and mom post pump.  LC reviewed LC plan with the MBU RN for 7P to & 7A .     Maternal Data Has patient been taught Hand Expression?: Yes  Feeding Feeding Type: Formula Length of feed: 20 min  LATCH Score/Interventions Latch: Grasps breast easily,  tongue down, lips flanged, rhythmical sucking. Intervention(s): Skin to skin;Waking techniques;Teach feeding cues Intervention(s): Adjust position;Assist with latch;Breast massage;Breast compression  Audible Swallowing: Spontaneous and intermittent  Type of Nipple: Everted at rest and after stimulation  Comfort (Breast/Nipple): Filling, red/small blisters or bruises, mild/mod discomfort Problem noted: Engorgment (border line , mom lying bakc and ice ) Intervention(s): Ice  Problem noted: Filling  Hold (Positioning): Assistance needed to correctly position infant at breast and maintain latch. Intervention(s): Breastfeeding basics reviewed  LATCH Score: 8  Lactation Tools Discussed/Used Tools: 53F feeding tube / Syringe Breast pump type: Double-Electric Breast Pump (after icing the left breast - set up pump for the left breast and decreased flange to #24 / good fit ) WIC Program: No   Consult Status Consult Status: Follow-up Date: 11/12/15 Follow-up type: In-patient    Matilde SprangMargaret Ann Santiana Glidden 11/11/2015, 8:13 PM

## 2015-11-11 NOTE — Lactation Note (Signed)
This note was copied from a baby's chart. Lactation Consultation Note  Patient Name: Sandy Gilmore Date: 11/11/2015 Reason for consult: Follow-up assessment;Late preterm infant;Infant < 6lbs;Hyperbilirubinemia   Follow up with mom of 68 hour old infant. Mom reports infant has been BF using 5 fr feeding tube at the breast, parents reports infant is sleepy unless flow continues at the breast, dad assists infant with gently pushing formula through feeding tube to maintain flow. Mom reports her breasts are larger today and are feeling fuller. Mom reports she has had some emotional difficulty over the last few days and is pleased her milk is starting to come in. Parents feel they are doing well with using the 5 fr feeding tube and want to continue with this method of feeding at this time. They report dad will be home for the next week to assist mom with feeding. Discussed with mom that if she is unable to feed using the feeding tube is dad is not home, it is ok to feed infant via bottle. The most important things to accomplish are to feed the infant and protect her supply.  Mom reports she is pumping every 3 hours after feeding infant at breast and she is starting to get a few gtts. Offered to assist with feeding and parents reported it is time to feed infant.  We awakened Sandy Gilmore, mom got herself comfortable and dad prepared 5 fr feeding tube. Infant was handed to mom and she latched him independently, dad then gave mom the 5 fr feeding tube and mom inserted it into his mouth. Sandy Gilmore was noted to have flanged lips with latch and a rhythmic suck, he maintained suckling if flow was present, he tranferred 12 cc at breast out of 5 fr feeding tube.  He fed for about 25 minutes and then we removed him to burp him and switch sides, he would not latch to second side. Dad then finger fed Sandy Gilmore 18 cc and Sandy Gilmore tolerated the feeding well.   Infant is noted to have an anterior frenulum. Did not discuss with family  and Pediatrician is aware.   Parents have LPT infant feeding policy and are aware to increase supplement based on day of age. Advised to always give EBM via syringe before adding formula. Discussed milk coming to volume. Mom voiced understanding.   Plan was made with parents and written plan was made. Parents undecided if they will go home today or tomorrow. Will return with written plan and OP LC appt.    Maternal Data Formula Feeding for Exclusion: No Has patient been taught Hand Expression?: Yes Does the patient have breastfeeding experience prior to this delivery?: No  Feeding Feeding Type: Breast Fed Length of feed: 20 min  LATCH Score/Interventions Latch: Repeated attempts needed to sustain latch, nipple held in mouth throughout feeding, stimulation needed to elicit sucking reflex. Intervention(s): Skin to skin;Teach feeding cues;Waking techniques Intervention(s): Breast compression;Breast massage;Assist with latch;Adjust position  Audible Swallowing: Spontaneous and intermittent (with 5 french feeding tube and formula) Intervention(s): Skin to skin;Hand expression  Type of Nipple: Everted at rest and after stimulation  Comfort (Breast/Nipple): Soft / non-tender     Hold (Positioning): Assistance needed to correctly position infant at breast and maintain latch. Intervention(s): Breastfeeding basics reviewed;Support Pillows;Position options;Skin to skin  LATCH Score: 8  Lactation Tools Discussed/Used Tools: 64F feeding tube / Syringe Pump Review: Setup, frequency, and cleaning;Milk Storage Initiated by:: Reviewed   Consult Status Consult Status: Follow-up Date: 11/12/15 Follow-up type: In-patient  Silas FloodSharon S Crissa Sowder 11/11/2015, 2:48 PM

## 2015-11-12 ENCOUNTER — Ambulatory Visit: Payer: Self-pay

## 2015-11-12 NOTE — Lactation Note (Signed)
This note was copied from a baby's chart. Lactation Consultation Note: Mother is still engorgement this am. She is hand expressing to soften breast. Infant is just coming back from circumcision. Infant is screaming and both parents afraid to pick up baby due to circumcision. Lots of teaching with parents. Mother placed infant in cross-cradle hold and put infant to breast . Infant began slightly tugging the breast. Mother has large nipples but infant  Is able to get entire mouth around nipple and past nipple onto the areola. Parents very receptive to plan . Plan of care reviewed for this LPI. Mother concerned that she doesn't have enough milk . She is only breastfeeding on one side each feeding. Advised her to begin to offer the alternate breast each feeding. Mother to then supplement infant with ebm and formula if needed. Mother advised to continue to post pump until infant is exclusively breastfeeding. Mother was advised to follow up with lactation services for more assistance. Mother was informed of available lactation resources. Mother receptive to all teaching.   Patient Name: Sandy Gilmore Sandy Gilmore XBJYN'WToday's Date: 11/12/2015 Reason for consult: Follow-up assessment   Maternal Data    Feeding Feeding Type: Breast Fed  LATCH Score/Interventions Latch: Repeated attempts needed to sustain latch, nipple held in mouth throughout feeding, stimulation needed to elicit sucking reflex.  Audible Swallowing: A few with stimulation  Type of Nipple: Everted at rest and after stimulation  Comfort (Breast/Nipple): Filling, red/small blisters or bruises, mild/mod discomfort Problem noted: Engorgment Intervention(s): Ice  Problem noted: Filling;Mild/Moderate discomfort Interventions (Filling): Massage  Hold (Positioning): Assistance needed to correctly position infant at breast and maintain latch.  LATCH Score: 6  Lactation Tools Discussed/Used     Consult Status      Michel BickersKendrick, Rheana Casebolt  McCoy 11/12/2015, 10:24 AM

## 2015-11-16 ENCOUNTER — Ambulatory Visit (HOSPITAL_COMMUNITY)
Admission: RE | Admit: 2015-11-16 | Discharge: 2015-11-16 | Disposition: A | Discharge status: Home / Self Care | Payer: BLUE CROSS/BLUE SHIELD | Source: Ambulatory Visit | Attending: Obstetrics and Gynecology | Admitting: Obstetrics and Gynecology

## 2015-11-16 NOTE — Lactation Note (Signed)
Lactation Consult  Mother's reason for visit:  Infant was born at 37 weeks. This out patient visit was schdeuled on day of discharge to follow infants feedings.  Visit Type: feeding assessment Appointment Notes:  Mother is still breastfeeding and states that infant is feeding well ,but she know he is not getting enough so she uses SNS about 5 times with breastfeeding. Consult:  Initial Lactation Consultant:  Michel BickersKendrick, Marvelene Stoneberg McCoy  ________________________________________________________________________     Mother's Name: Sandy Gilmore Type of delivery:  Vaginal del Breastfeeding Experience: none Maternal Medical Conditions:  none Maternal Medications:  Pain meds , colace, prenatal vits  ________________________________________________________________________  Breastfeeding History (Post Discharge)  Frequency of breastfeeding every 3 hours :6-8 times daily Duration of feeding:20 mins  Supplementation  Formula:  Volume 25 ml Every 2-3 hours       Brand: Enfamil  Breastmilk:  Volume 25 ml Frequency: every 2-3 hours     Method:  Supplmental nutrition system  Pumping  Type of pump:  Spectra 2 Frequency: 3 times daily  Volume:  15-25 ml ,    Infant Intake and Output Assessment  Voids:5-7   in 24 hrs.  Color:  Clear yellow Stools: 1-4 in 24 hrs.  Color:  Yellow  ________________________________________________________________________  Maternal Breast Assessment  Breast:  Full Nipple:  Erect Pain level:  0 Pain interventions:  Bra  _______________________________________________________________________ Feeding Assessment/Evaluation Mother independently latched infant when breastfeeding on the left breast. Infant has a wide open gape. Observed a few good suckles and then off to sleep. Infant transferred 10 ml from each breast.  Infant's oral assessment:  WNL  Positioning:  Football Right breast  LATCH documentation:  Latch:  2 = Grasps breast easily, tongue down,  lips flanged, rhythmical sucking.  Audible swallowing:  2 = Spontaneous and intermittent  Type of nipple:  2 = Everted at rest and after stimulation  Comfort (Breast/Nipple):  1 = Filling, red/small blisters or bruises, mild/mod discomfort  Hold (Positioning):  1 = Assistance needed to correctly position infant at breast and maintain latch  LATCH score:  8  Attached assessment:  Shallow  Lips flanged:  Yes.    Lips untucked:  Yes.    Suck assessment:  Displays both   Pre-feed weight:  2644 Post-feed weight:  2654 Amount transferred:  10 ml  :    Infant's oral assessment:  WNL  Positioning:  Football Left breast  LATCH documentation:  Latch:  2 = Grasps breast easily, tongue down, lips flanged, rhythmical sucking.  Audible swallowing:  2 = Spontaneous and intermittent  Type of nipple:  2 = Everted at rest and after stimulation  Comfort (Breast/Nipple):  1 = Filling, red/small blisters or bruises, mild/mod discomfort  Hold (Positioning):  1 = Assistance needed to correctly position infant at breast and maintain latch  LATCH score:  8  Attached assessment:  Shallow  Lips flanged:  Yes.    Lips untucked:  Yes.    Suck assessment:  Displays both    Pre-feed weight:  2654 Post-feed weight: 2664  Amount transferred: 10 ml  Total amount transferred: 20 ml Total supplement given:  30 ml from bottle Advised mother to breastfeed for 15 mins on each breast to prevent infant from getting so tired.   Supplement infant with 45-60 ml of ebm/formula Mother to pump after each feeding for 15-20 min. Advised mother to follow up in one week ( parents state that are busy and would like to call and schedule appt in  2 weeks)

## 2017-01-03 ENCOUNTER — Encounter: Payer: Self-pay | Admitting: Medical

## 2017-01-03 ENCOUNTER — Ambulatory Visit: Payer: Self-pay | Admitting: Medical

## 2017-01-03 VITALS — BP 98/72 | HR 91 | Temp 97.4°F | Resp 16 | Ht 60.0 in | Wt 95.0 lb

## 2017-01-03 DIAGNOSIS — T7840XA Allergy, unspecified, initial encounter: Secondary | ICD-10-CM

## 2017-01-03 MED ORDER — PREDNISONE 10 MG (21) PO TBPK: ORAL_TABLET | ORAL | 0 refills | Status: DC

## 2017-01-03 NOTE — Progress Notes (Signed)
Pt presents to clinic with c/o rash to upper legs bilateral x 2 days that is spreading to torso. Denies pain but very itchy to rash site. Denies any dietary or environmental changes.

## 2017-01-03 NOTE — Patient Instructions (Signed)
Food Allergy A food allergy is when your body reacts to a food in a way that is not normal. The reaction can be gentle or very bad. Signs of a Gentle Reaction  Stuffy nose.  Tingling in the mouth.  An itchy, red rash.  Throwing up (vomiting).  Watery poop (diarrhea). Signs of a Very Bad Reaction  Puffiness (swelling). This may be on the lips, face, or tongue, or in the mouth or throat.  Breathing loudly (wheezing).  A hoarse voice.  Itchy, red, swollen areas of skin (hives).  Dizziness or light-headedness.  Fainting.  Trouble breathing or swallowing.  A tight feeling in the chest.  A very fast heartbeat. Follow these instructions at home: General instructions  Avoid the foods that you are allergic to.  Read food labels. Look for ingredients that you are allergic to.  When you are at a restaurant, tell your server that you have an allergy. Ask if your meal has an ingredient that you are allergic to.  Take medicines only as told by your doctor. Do not drive until the medicine has worn off, unless your doctor says it is okay.  Tell all people who care for you that you have a food allergy. This includes your doctor and dentist.  If you think that you might be allergic to something else, talk with your doctor. Do not eat a food to see if you are allergic to it without talking with your doctor first. If you have a very bad allergy:  Wear a bracelet or necklace that says you have an allergy.  Carry your allergy kit (anaphylaxis kit) or an allergy shot (epinephrine injection) with you all the time. Use them as told by your doctor.  Make sure that you, your family, and your boss know: ? How to use your allergy kit. ? How to give you an allergy shot.  If you use the medicine epinephrine: ? Get more right away in case you have another reaction. ? Get help. You can have a life-threatening reaction after taking the medicine. If you are being tested for an  allergy :  Follow a diet as told by your doctor.  Keep a food diary as told by your doctor. Every day, write down: ? What you eat and drink and when. ? What problems you have and when. Contact a doctor if:  The signs of your reaction have not gone away within 2 days.  The signs of your reaction get worse.  You have new signs of a reaction. Get help right away if:  You use the medicine epinephrine.  You are having a very bad reaction. Signs of a very bad reaction are: ? Puffiness. This may be on the lips, face, or tongue, or in the mouth or throat. ? Breathing loudly. ? A hoarse voice. ? Itchy, red swollen areas of skin. ? Dizziness or light-headedness. ? Fainting. ? Trouble breathing or swallowing. ? A tight feeling in the chest. ? A very fast heartbeat. This information is not intended to replace advice given to you by your health care provider. Make sure you discuss any questions you have with your health care provider. Document Released: 07/13/2009 Document Revised: 07/01/2015 Document Reviewed: 11/04/2013 Elsevier Interactive Patient Education  2018 [employer]  

## 2017-01-03 NOTE — Progress Notes (Signed)
   Subjective:    Patient ID: Carroll SageQian Guiney, female    DOB: 02/12/1980, 36 y.o.   MRN: [id number]  HPI  [age] yo female in non acute distress started 2 days ago after eating crab cakes ( just 1). Rash started  2 hours later,  Initiallly with red bumps in groin area, then itching at nght., no pain.  Peroid started yesterday thinks pad irritated the rash. Now spreading to trunk Spreading to left arm while at appointment.mild right eye upper lid swelling.  Patient also states that eating melons like honeydew and cantalope causes tingling in her throat so she avoids them .  Review of Systems  Constitutional: Negative for chills and fever.  HENT: Negative for congestion, ear pain and sore throat. Mouth sores: Prednisone.   Eyes: Positive for itching (little bit on the right side). Negative for discharge.  Respiratory: Negative for cough, shortness of breath and wheezing.   Cardiovascular: Negative for chest pain, palpitations and leg swelling.  Gastrointestinal: Negative for abdominal pain and diarrhea.  Endocrine: Negative for cold intolerance and heat intolerance (causes more itching).  Genitourinary: Negative for dysuria.  Musculoskeletal: Negative for myalgias.  Skin: Positive for rash.  Allergic/Immunologic: Positive for environmental allergies (spring time). Negative for food allergies.  Neurological: Negative for dizziness, syncope and light-headedness.  Hematological: Negative for adenopathy.  Psychiatric/Behavioral: Negative for behavioral problems, self-injury and suicidal ideas. The patient is not nervous/anxious.        Objective:   Physical Exam  Constitutional: She is oriented to person, place, and time. She appears well-developed and well-nourished.  HENT:  Head: Normocephalic and atraumatic.  Eyes: Conjunctivae and EOM are normal. Pupils are equal, round, and reactive to light.  Neck: Normal range of motion. Neck supple.  Cardiovascular: Normal rate, regular rhythm and normal heart  sounds. Exam reveals no gallop and no friction rub.  No murmur heard. Pulmonary/Chest: Effort normal and breath sounds normal. No respiratory distress. She has no wheezes. She has no rales.  Neurological: She is alert and oriented to person, place, and time.  Skin: Skin is warm and dry. Rash noted. There is erythema.  Psychiatric: She has a normal mood and affect. Her behavior is normal. Judgment and thought content normal.  Nursing note and vitals reviewed.   No pharyngeal swelling  Or tongue swelling.     groin area red and uticarial and maculopapular on trunk and left anterior arm.  Assessment & Plan:  Allergic Reaction possibly to crab cakes.  Avoid  All shellfish for now. Recommended OTC Zyrtec during  the day, OTC Benadryl 25 mg at night if still itching. OTC Zantac 150 mg daily. Meds ordered this encounter  Medications  . predniSONE (STERAPRED UNI-PAK 21 TAB) 10 MG (21) TBPK tablet    Sig: Take 6 tablets by mouth today then  5 tablets tomorrow then one less each day there after. Take with food.    Dispense:  21 tablet    Refill:  0   Will refer for allergy testing. Return to the clinic in 3 -4 days if not improved. Patient verbalizes understanding and has no questions at discharge.

## 2017-01-08 DIAGNOSIS — R21 Rash and other nonspecific skin eruption: Secondary | ICD-10-CM | POA: Diagnosis not present

## 2017-01-17 ENCOUNTER — Other Ambulatory Visit: Payer: Self-pay

## 2017-01-18 DIAGNOSIS — Z681 Body mass index (BMI) 19 or less, adult: Secondary | ICD-10-CM | POA: Diagnosis not present

## 2017-01-18 DIAGNOSIS — Z01419 Encounter for gynecological examination (general) (routine) without abnormal findings: Secondary | ICD-10-CM | POA: Diagnosis not present

## 2017-01-18 DIAGNOSIS — Z1151 Encounter for screening for human papillomavirus (HPV): Secondary | ICD-10-CM | POA: Diagnosis not present

## 2017-01-24 ENCOUNTER — Other Ambulatory Visit: Payer: Self-pay

## 2017-01-24 DIAGNOSIS — Z0000 Encounter for general adult medical examination without abnormal findings: Secondary | ICD-10-CM

## 2017-01-25 LAB — CBC WITH DIFFERENTIAL/PLATELET
BASOS ABS: 0 10*3/uL (ref 0.0–0.2)
Basos: 0 %
EOS (ABSOLUTE): 0 10*3/uL (ref 0.0–0.4)
Eos: 0 %
Hematocrit: 43.4 % (ref 34.0–46.6)
Hemoglobin: 14.6 g/dL (ref 11.1–15.9)
IMMATURE GRANS (ABS): 0 10*3/uL (ref 0.0–0.1)
IMMATURE GRANULOCYTES: 0 %
LYMPHS: 27 %
Lymphocytes Absolute: 1.6 10*3/uL (ref 0.7–3.1)
MCH: 31.9 pg (ref 26.6–33.0)
MCHC: 33.6 g/dL (ref 31.5–35.7)
MCV: 95 fL (ref 79–97)
MONOCYTES: 6 %
Monocytes Absolute: 0.4 10*3/uL (ref 0.1–0.9)
NEUTROS PCT: 67 %
Neutrophils Absolute: 3.9 10*3/uL (ref 1.4–7.0)
PLATELETS: 238 10*3/uL (ref 150–379)
RBC: 4.57 x10E6/uL (ref 3.77–5.28)
RDW: 13.1 % (ref 12.3–15.4)
WBC: 5.9 10*3/uL (ref 3.4–10.8)

## 2017-01-25 LAB — COMPREHENSIVE METABOLIC PANEL
A/G RATIO: 2 (ref 1.2–2.2)
ALT: 15 IU/L (ref 0–32)
AST: 17 IU/L (ref 0–40)
Albumin: 4.5 g/dL (ref 3.5–5.5)
Alkaline Phosphatase: 33 IU/L — ABNORMAL LOW (ref 39–117)
BILIRUBIN TOTAL: 0.5 mg/dL (ref 0.0–1.2)
BUN/Creatinine Ratio: 14 (ref 9–23)
BUN: 10 mg/dL (ref 6–20)
CALCIUM: 8.8 mg/dL (ref 8.7–10.2)
CHLORIDE: 106 mmol/L (ref 96–106)
CO2: 24 mmol/L (ref 20–29)
Creatinine, Ser: 0.71 mg/dL (ref 0.57–1.00)
GFR calc Af Amer: 125 mL/min/{1.73_m2} (ref >59)
GFR, EST NON AFRICAN AMERICAN: 108 mL/min/{1.73_m2} (ref >59)
GLUCOSE: 95 mg/dL (ref 65–99)
Globulin, Total: 2.3 g/dL (ref 1.5–4.5)
POTASSIUM: 4 mmol/L (ref 3.5–5.2)
Sodium: 145 mmol/L — ABNORMAL HIGH (ref 134–144)
Total Protein: 6.8 g/dL (ref 6.0–8.5)

## 2017-01-25 LAB — HGB A1C W/O EAG: HEMOGLOBIN A1C: 5.8 % — AB (ref 4.8–5.6)

## 2017-01-25 LAB — LIPID PANEL
CHOLESTEROL TOTAL: 160 mg/dL (ref 100–199)
Chol/HDL Ratio: 2.5 ratio (ref 0.0–4.4)
HDL: 64 mg/dL (ref >39)
LDL Calculated: 87 mg/dL (ref 0–99)
TRIGLYCERIDES: 47 mg/dL (ref 0–149)
VLDL CHOLESTEROL CAL: 9 mg/dL (ref 5–40)

## 2017-02-12 DIAGNOSIS — R899 Unspecified abnormal finding in specimens from other organs, systems and tissues: Secondary | ICD-10-CM | POA: Diagnosis not present

## 2017-02-12 DIAGNOSIS — Z0000 Encounter for general adult medical examination without abnormal findings: Secondary | ICD-10-CM | POA: Diagnosis not present

## 2017-05-08 ENCOUNTER — Other Ambulatory Visit: Payer: Self-pay

## 2017-05-08 DIAGNOSIS — R899 Unspecified abnormal finding in specimens from other organs, systems and tissues: Secondary | ICD-10-CM

## 2017-05-11 ENCOUNTER — Other Ambulatory Visit: Payer: Self-pay

## 2017-05-11 DIAGNOSIS — R899 Unspecified abnormal finding in specimens from other organs, systems and tissues: Secondary | ICD-10-CM

## 2017-05-12 LAB — HGB A1C W/O EAG: HEMOGLOBIN A1C: 5.7 % — AB (ref 4.8–5.6)

## 2018-01-22 DIAGNOSIS — Z681 Body mass index (BMI) 19 or less, adult: Secondary | ICD-10-CM | POA: Diagnosis not present

## 2018-01-22 DIAGNOSIS — Z01419 Encounter for gynecological examination (general) (routine) without abnormal findings: Secondary | ICD-10-CM | POA: Diagnosis not present

## 2018-02-19 ENCOUNTER — Other Ambulatory Visit: Payer: Self-pay

## 2018-02-19 DIAGNOSIS — R7303 Prediabetes: Secondary | ICD-10-CM

## 2018-02-19 DIAGNOSIS — Z0000 Encounter for general adult medical examination without abnormal findings: Secondary | ICD-10-CM

## 2018-02-20 DIAGNOSIS — Z0000 Encounter for general adult medical examination without abnormal findings: Secondary | ICD-10-CM | POA: Diagnosis not present

## 2018-02-20 LAB — COMPREHENSIVE METABOLIC PANEL
A/G RATIO: 1.9 (ref 1.2–2.2)
ALT: 17 IU/L (ref 0–32)
AST: 16 IU/L (ref 0–40)
Albumin: 4.3 g/dL (ref 3.5–5.5)
Alkaline Phosphatase: 34 IU/L — ABNORMAL LOW (ref 39–117)
BILIRUBIN TOTAL: 0.5 mg/dL (ref 0.0–1.2)
BUN/Creatinine Ratio: 15 (ref 9–23)
BUN: 10 mg/dL (ref 6–20)
CALCIUM: 8.7 mg/dL (ref 8.7–10.2)
CO2: 21 mmol/L (ref 20–29)
Chloride: 102 mmol/L (ref 96–106)
Creatinine, Ser: 0.68 mg/dL (ref 0.57–1.00)
GFR calc Af Amer: 127 mL/min/{1.73_m2} (ref >59)
GFR, EST NON AFRICAN AMERICAN: 111 mL/min/{1.73_m2} (ref >59)
Globulin, Total: 2.3 g/dL (ref 1.5–4.5)
Glucose: 88 mg/dL (ref 65–99)
POTASSIUM: 4.4 mmol/L (ref 3.5–5.2)
Sodium: 139 mmol/L (ref 134–144)
Total Protein: 6.6 g/dL (ref 6.0–8.5)

## 2018-02-20 LAB — LIPID PANEL
CHOL/HDL RATIO: 2.5 ratio (ref 0.0–4.4)
Cholesterol, Total: 158 mg/dL (ref 100–199)
HDL: 63 mg/dL (ref >39)
LDL CALC: 82 mg/dL (ref 0–99)
TRIGLYCERIDES: 63 mg/dL (ref 0–149)
VLDL Cholesterol Cal: 13 mg/dL (ref 5–40)

## 2018-02-20 LAB — CBC WITH DIFFERENTIAL/PLATELET
BASOS: 0 %
Basophils Absolute: 0 10*3/uL (ref 0.0–0.2)
EOS (ABSOLUTE): 0 10*3/uL (ref 0.0–0.4)
EOS: 0 %
HEMOGLOBIN: 15 g/dL (ref 11.1–15.9)
Hematocrit: 45 % (ref 34.0–46.6)
IMMATURE GRANULOCYTES: 0 %
Immature Grans (Abs): 0 10*3/uL (ref 0.0–0.1)
LYMPHS ABS: 1.7 10*3/uL (ref 0.7–3.1)
Lymphs: 29 %
MCH: 31.7 pg (ref 26.6–33.0)
MCHC: 33.3 g/dL (ref 31.5–35.7)
MCV: 95 fL (ref 79–97)
MONOCYTES: 7 %
Monocytes Absolute: 0.4 10*3/uL (ref 0.1–0.9)
NEUTROS ABS: 3.7 10*3/uL (ref 1.4–7.0)
Neutrophils: 64 %
Platelets: 257 10*3/uL (ref 150–450)
RBC: 4.73 x10E6/uL (ref 3.77–5.28)
RDW: 12.3 % (ref 11.7–15.4)
WBC: 5.8 10*3/uL (ref 3.4–10.8)

## 2018-02-20 LAB — HGB A1C W/O EAG: Hgb A1c MFr Bld: 5.6 % (ref 4.8–5.6)

## 2018-04-12 ENCOUNTER — Other Ambulatory Visit: Payer: Self-pay

## 2018-04-12 ENCOUNTER — Ambulatory Visit: Payer: Self-pay | Admitting: Nurse Practitioner

## 2018-04-12 ENCOUNTER — Encounter: Payer: Self-pay | Admitting: Nurse Practitioner

## 2018-04-12 VITALS — BP 122/80 | HR 90 | Temp 97.8°F | Resp 16 | Ht 60.0 in | Wt 96.0 lb

## 2018-04-12 DIAGNOSIS — J038 Acute tonsillitis due to other specified organisms: Principal | ICD-10-CM

## 2018-04-12 DIAGNOSIS — B9789 Other viral agents as the cause of diseases classified elsewhere: Secondary | ICD-10-CM

## 2018-04-12 NOTE — Progress Notes (Signed)
   Subjective:    Patient ID: Sandy Gilmore, female    DOB: [birth year], 38 y.o.   MRN: [id number]  HPI Sandy Gilmore is here today with c/o sore throat x 1 week. She reports she has since developed some fatigue. Reports she has checked her throat and tonsils were red and swollen. Has taken Dayquil and herbal cough and flu "Umcka". Denies fever, cough, facial pain, or ear pain. She admits son was sick with the flu and now has cold symptoms with no fever.   Review of Systems  Constitutional: Positive for fatigue.  HENT: Positive for sore throat.   Respiratory: Negative for cough, shortness of breath and wheezing.   Cardiovascular: Negative for chest pain.       Objective:   Physical Exam Vitals signs reviewed.  Constitutional:      Appearance: She is well-developed.  HENT:     Head: Normocephalic and atraumatic.     Comments: No maxillary or frontal sinus tenderness    Ears:     Comments: Bilateral ears with no erythema and opaque TM    Nose: No congestion.     Mouth/Throat:     Mouth: Mucous membranes are moist. No oral lesions.     Pharynx: No pharyngeal swelling, oropharyngeal exudate or posterior oropharyngeal erythema.     Tonsils: No tonsillar exudate. Swelling: 1+ on the right. 1+ on the left.     Comments: Tonsils mildly injected with no edema, erythema or exuadate Neck:     Musculoskeletal: Normal range of motion.  Cardiovascular:     Rate and Rhythm: Normal rate and regular rhythm.     Heart sounds: Normal heart sounds.  Pulmonary:     Effort: Pulmonary effort is normal. No respiratory distress.     Breath sounds: Normal breath sounds.  Abdominal:     General: Bowel sounds are normal.     Palpations: Abdomen is soft.  Musculoskeletal: Normal range of motion.  Skin:    General: Skin is warm and dry.  Neurological:     Mental Status: She is alert and oriented to person, place, and time.  Psychiatric:        Mood and Affect: Mood normal.           Assessment & Plan:

## 2018-04-12 NOTE — Patient Instructions (Signed)
Stop current treatment and start over the counter Zyrtec to assist with drainage Consider Ibuprofen for throat pain and eat when taking this Fluids and rest discussed and encouraged Encouraged patient to call the office or primary care doctor for an appointment if no improvement in symptoms, or if symptoms change or worsen after 72 hours of planned treatment. Patient verbalized understanding of all instructions given/reviewed and has no further questions or concerns at this time.

## 2018-04-15 ENCOUNTER — Ambulatory Visit: Payer: Self-pay | Admitting: Medical

## 2018-10-16 ENCOUNTER — Ambulatory Visit: Payer: Self-pay

## 2018-10-16 ENCOUNTER — Other Ambulatory Visit: Payer: Self-pay

## 2018-10-16 DIAGNOSIS — Z23 Encounter for immunization: Secondary | ICD-10-CM

## 2019-10-23 ENCOUNTER — Other Ambulatory Visit: Payer: Self-pay

## 2019-10-23 DIAGNOSIS — Z0000 Encounter for general adult medical examination without abnormal findings: Secondary | ICD-10-CM

## 2019-10-25 LAB — CBC WITH DIFFERENTIAL/PLATELET
Basophils Absolute: 0 10*3/uL (ref 0.0–0.2)
Basos: 1 %
EOS (ABSOLUTE): 0 10*3/uL (ref 0.0–0.4)
Eos: 1 %
Hematocrit: 46.6 % (ref 34.0–46.6)
Hemoglobin: 15 g/dL (ref 11.1–15.9)
Immature Grans (Abs): 0 10*3/uL (ref 0.0–0.1)
Immature Granulocytes: 0 %
Lymphocytes Absolute: 1.6 10*3/uL (ref 0.7–3.1)
Lymphs: 29 %
MCH: 31.6 pg (ref 26.6–33.0)
MCHC: 32.2 g/dL (ref 31.5–35.7)
MCV: 98 fL — ABNORMAL HIGH (ref 79–97)
Monocytes Absolute: 0.4 10*3/uL (ref 0.1–0.9)
Monocytes: 7 %
Neutrophils Absolute: 3.5 10*3/uL (ref 1.4–7.0)
Neutrophils: 62 %
Platelets: 216 10*3/uL (ref 150–450)
RBC: 4.75 x10E6/uL (ref 3.77–5.28)
RDW: 12.3 % (ref 11.7–15.4)
WBC: 5.6 10*3/uL (ref 3.4–10.8)

## 2019-10-25 LAB — LIPID PANEL
Chol/HDL Ratio: 2.7 ratio (ref 0.0–4.4)
Cholesterol, Total: 173 mg/dL (ref 100–199)
HDL: 63 mg/dL (ref >39)
LDL Chol Calc (NIH): 99 mg/dL (ref 0–99)
Triglycerides: 58 mg/dL (ref 0–149)
VLDL Cholesterol Cal: 11 mg/dL (ref 5–40)

## 2019-10-25 LAB — COMPREHENSIVE METABOLIC PANEL
ALT: 21 IU/L (ref 0–32)
AST: 18 IU/L (ref 0–40)
Albumin/Globulin Ratio: 2 (ref 1.2–2.2)
Albumin: 4.7 g/dL (ref 3.8–4.8)
Alkaline Phosphatase: 35 IU/L — ABNORMAL LOW (ref 44–121)
BUN/Creatinine Ratio: 14 (ref 9–23)
BUN: 9 mg/dL (ref 6–24)
Bilirubin Total: 0.4 mg/dL (ref 0.0–1.2)
CO2: 25 mmol/L (ref 20–29)
Calcium: 9.1 mg/dL (ref 8.7–10.2)
Chloride: 104 mmol/L (ref 96–106)
Creatinine, Ser: 0.65 mg/dL (ref 0.57–1.00)
GFR calc Af Amer: 128 mL/min/{1.73_m2} (ref >59)
GFR calc non Af Amer: 111 mL/min/{1.73_m2} (ref >59)
Globulin, Total: 2.4 g/dL (ref 1.5–4.5)
Glucose: 95 mg/dL (ref 65–99)
Potassium: 4.2 mmol/L (ref 3.5–5.2)
Sodium: 140 mmol/L (ref 134–144)
Total Protein: 7.1 g/dL (ref 6.0–8.5)

## 2019-10-25 LAB — HGB A1C W/O EAG: Hgb A1c MFr Bld: 5.5 % (ref 4.8–5.6)

## 2019-10-25 LAB — HCV INTERPRETATION

## 2019-10-25 LAB — HCV AB W REFLEX TO QUANT PCR: HCV Ab: <0.1 s/co ratio (ref 0.0–0.9)

## 2020-10-19 ENCOUNTER — Other Ambulatory Visit: Payer: Self-pay

## 2020-10-19 DIAGNOSIS — Z0000 Encounter for general adult medical examination without abnormal findings: Secondary | ICD-10-CM

## 2020-10-19 DIAGNOSIS — Z1159 Encounter for screening for other viral diseases: Secondary | ICD-10-CM

## 2020-10-20 LAB — COMPREHENSIVE METABOLIC PANEL
ALT: 15 IU/L (ref 0–32)
AST: 19 IU/L (ref 0–40)
Albumin/Globulin Ratio: 1.9 (ref 1.2–2.2)
Albumin: 4.6 g/dL (ref 3.8–4.8)
Alkaline Phosphatase: 39 IU/L — ABNORMAL LOW (ref 44–121)
BUN/Creatinine Ratio: 16 (ref 9–23)
BUN: 11 mg/dL (ref 6–24)
Bilirubin Total: 0.4 mg/dL (ref 0.0–1.2)
CO2: 25 mmol/L (ref 20–29)
Calcium: 9 mg/dL (ref 8.7–10.2)
Chloride: 100 mmol/L (ref 96–106)
Creatinine, Ser: 0.67 mg/dL (ref 0.57–1.00)
Globulin, Total: 2.4 g/dL (ref 1.5–4.5)
Glucose: 91 mg/dL (ref 65–99)
Potassium: 4.4 mmol/L (ref 3.5–5.2)
Sodium: 139 mmol/L (ref 134–144)
Total Protein: 7 g/dL (ref 6.0–8.5)
eGFR: 112 mL/min/{1.73_m2} (ref >59)

## 2020-10-20 LAB — LIPID PANEL
Chol/HDL Ratio: 2.6 ratio (ref 0.0–4.4)
Cholesterol, Total: 167 mg/dL (ref 100–199)
HDL: 65 mg/dL (ref >39)
LDL Chol Calc (NIH): 90 mg/dL (ref 0–99)
Triglycerides: 59 mg/dL (ref 0–149)
VLDL Cholesterol Cal: 12 mg/dL (ref 5–40)

## 2020-10-20 LAB — CBC WITH DIFFERENTIAL/PLATELET
Basophils Absolute: 0.1 10*3/uL (ref 0.0–0.2)
Basos: 1 %
EOS (ABSOLUTE): 0.1 10*3/uL (ref 0.0–0.4)
Eos: 1 %
Hematocrit: 46.5 % (ref 34.0–46.6)
Hemoglobin: 15 g/dL (ref 11.1–15.9)
Immature Grans (Abs): 0 10*3/uL (ref 0.0–0.1)
Immature Granulocytes: 0 %
Lymphocytes Absolute: 1.7 10*3/uL (ref 0.7–3.1)
Lymphs: 31 %
MCH: 31.1 pg (ref 26.6–33.0)
MCHC: 32.3 g/dL (ref 31.5–35.7)
MCV: 96 fL (ref 79–97)
Monocytes Absolute: 0.5 10*3/uL (ref 0.1–0.9)
Monocytes: 8 %
Neutrophils Absolute: 3.1 10*3/uL (ref 1.4–7.0)
Neutrophils: 59 %
Platelets: 261 10*3/uL (ref 150–450)
RBC: 4.83 x10E6/uL (ref 3.77–5.28)
RDW: 12.4 % (ref 11.7–15.4)
WBC: 5.4 10*3/uL (ref 3.4–10.8)

## 2020-10-20 LAB — HCV INTERPRETATION

## 2020-10-20 LAB — HCV AB W REFLEX TO QUANT PCR: HCV Ab: <0.1 s/co ratio (ref 0.0–0.9)

## 2020-10-20 LAB — HGB A1C W/O EAG: Hgb A1c MFr Bld: 5.5 % (ref 4.8–5.6)

## 2020-12-01 ENCOUNTER — Other Ambulatory Visit: Payer: Self-pay

## 2020-12-01 ENCOUNTER — Ambulatory Visit: Payer: BC Managed Care – PPO

## 2020-12-01 DIAGNOSIS — Z23 Encounter for immunization: Secondary | ICD-10-CM

## 2021-10-17 ENCOUNTER — Other Ambulatory Visit: Payer: Self-pay

## 2021-10-19 ENCOUNTER — Other Ambulatory Visit: Payer: Self-pay

## 2021-10-19 DIAGNOSIS — Z131 Encounter for screening for diabetes mellitus: Secondary | ICD-10-CM

## 2021-10-19 DIAGNOSIS — Z0000 Encounter for general adult medical examination without abnormal findings: Secondary | ICD-10-CM

## 2021-10-19 DIAGNOSIS — Z136 Encounter for screening for cardiovascular disorders: Secondary | ICD-10-CM

## 2021-10-21 LAB — CBC WITH DIFFERENTIAL/PLATELET
Basophils Absolute: 0 10*3/uL (ref 0.0–0.2)
Basos: 1 %
EOS (ABSOLUTE): 0 10*3/uL (ref 0.0–0.4)
Eos: 1 %
Hematocrit: 44.4 % (ref 34.0–46.6)
Hemoglobin: 14.7 g/dL (ref 11.1–15.9)
Immature Grans (Abs): 0 10*3/uL (ref 0.0–0.1)
Immature Granulocytes: 0 %
Lymphocytes Absolute: 1.7 10*3/uL (ref 0.7–3.1)
Lymphs: 29 %
MCH: 31.6 pg (ref 26.6–33.0)
MCHC: 33.1 g/dL (ref 31.5–35.7)
MCV: 96 fL (ref 79–97)
Monocytes Absolute: 0.4 10*3/uL (ref 0.1–0.9)
Monocytes: 6 %
Neutrophils Absolute: 3.6 10*3/uL (ref 1.4–7.0)
Neutrophils: 63 %
Platelets: 276 10*3/uL (ref 150–450)
RBC: 4.65 x10E6/uL (ref 3.77–5.28)
RDW: 12.5 % (ref 11.7–15.4)
WBC: 5.7 10*3/uL (ref 3.4–10.8)

## 2021-10-21 LAB — COMPREHENSIVE METABOLIC PANEL
ALT: 10 IU/L (ref 0–32)
AST: 17 IU/L (ref 0–40)
Albumin/Globulin Ratio: 1.6 (ref 1.2–2.2)
Albumin: 4.3 g/dL (ref 3.9–4.9)
Alkaline Phosphatase: 35 IU/L — ABNORMAL LOW (ref 44–121)
BUN/Creatinine Ratio: 17 (ref 9–23)
BUN: 12 mg/dL (ref 6–24)
Bilirubin Total: 0.4 mg/dL (ref 0.0–1.2)
CO2: 21 mmol/L (ref 20–29)
Calcium: 9 mg/dL (ref 8.7–10.2)
Chloride: 102 mmol/L (ref 96–106)
Creatinine, Ser: 0.7 mg/dL (ref 0.57–1.00)
Globulin, Total: 2.7 g/dL (ref 1.5–4.5)
Glucose: 91 mg/dL (ref 70–99)
Potassium: 4.4 mmol/L (ref 3.5–5.2)
Sodium: 137 mmol/L (ref 134–144)
Total Protein: 7 g/dL (ref 6.0–8.5)
eGFR: 110 mL/min/{1.73_m2} (ref >59)

## 2021-10-21 LAB — LIPID PANEL
Chol/HDL Ratio: 2.5 ratio (ref 0.0–4.4)
Cholesterol, Total: 173 mg/dL (ref 100–199)
HDL: 68 mg/dL (ref >39)
LDL Chol Calc (NIH): 94 mg/dL (ref 0–99)
Triglycerides: 58 mg/dL (ref 0–149)
VLDL Cholesterol Cal: 11 mg/dL (ref 5–40)

## 2021-10-21 LAB — HGB A1C W/O EAG: Hgb A1c MFr Bld: 5.5 % (ref 4.8–5.6)

## 2023-02-03 ENCOUNTER — Emergency Department (HOSPITAL_BASED_OUTPATIENT_CLINIC_OR_DEPARTMENT_OTHER)
Admission: EM | Admit: 2023-02-03 | Discharge: 2023-02-03 | Disposition: A | Discharge status: Home / Self Care | Payer: BC Managed Care – PPO | Attending: Emergency Medicine | Admitting: Emergency Medicine

## 2023-02-03 ENCOUNTER — Other Ambulatory Visit: Payer: Self-pay

## 2023-02-03 ENCOUNTER — Encounter (HOSPITAL_BASED_OUTPATIENT_CLINIC_OR_DEPARTMENT_OTHER): Payer: Self-pay

## 2023-02-03 DIAGNOSIS — S0181XA Laceration without foreign body of other part of head, initial encounter: Secondary | ICD-10-CM

## 2023-02-03 DIAGNOSIS — W228XXA Striking against or struck by other objects, initial encounter: Secondary | ICD-10-CM | POA: Insufficient documentation

## 2023-02-03 DIAGNOSIS — Z23 Encounter for immunization: Secondary | ICD-10-CM | POA: Diagnosis not present

## 2023-02-03 DIAGNOSIS — S0993XA Unspecified injury of face, initial encounter: Secondary | ICD-10-CM | POA: Diagnosis present

## 2023-02-03 MED ORDER — TETANUS-DIPHTH-ACELL PERTUSSIS 5-2.5-18.5 LF-MCG/0.5 IM SUSY
0.5000 mL | PREFILLED_SYRINGE | Freq: Once | INTRAMUSCULAR | Status: AC
  Administered 2023-02-03: 0.5 mL via INTRAMUSCULAR
  Filled 2023-02-03: qty 0.5

## 2023-02-03 MED ORDER — LIDOCAINE HCL (PF) 1 % IJ SOLN
5.0000 mL | Freq: Once | INTRAMUSCULAR | Status: DC
  Filled 2023-02-03: qty 5

## 2023-02-03 NOTE — Discharge Instructions (Addendum)
You had 1 suture placed in your forehead today.  You must get your sutures removed in 5 days. We recommend visiting your PCP or an urgent care for suture removal. However, you may also return back to the ER if you are unable to be seen by your PCP or at urgent care.   You may gently clean the area around your laceration as needed with soap and water. Place antibiotic ointment such as bacitracin or neosporin over your laceration after cleaning the area.  Keep the laceration covered with sterile gauze as shown here if you are doing an activity in which it may get dirty. You may pick these supplies up at any drugstore.  Do not submerge your laceration in water (no baths, swimming) until it is fully healed. You may shower.   Your tetanus was updated today.  Return to the ER should you develop fever, chills, pus drainage from your wound, redness around your wound.

## 2023-02-03 NOTE — ED Triage Notes (Signed)
The patient a metal door swung and hit her on the head. She denied LOC. She does have a lac to the left forehead.

## 2023-02-03 NOTE — ED Notes (Signed)
ED Provider at bedside. 

## 2023-02-03 NOTE — ED Provider Notes (Cosign Needed)
[facility] Provider Note   CSN: [id number] Arrival date & time: 02/03/23  1807     History  Chief Complaint  Patient presents with   Head Injury    Sandy Gilmore is a 42 y.o. female with no significant past medical history presents with concern for being hit by a metal door earlier today.  States the door swung out into her and hit her on the head.  She denies any loss of consciousness, no nausea or vomiting, no changes in vision.  Reports feeling slightly unsteady, and feels this may be because she has not eaten today.  She is not on any blood thinners.  Thinks her last tetanus may have been 7 years ago, but not completely sure.   Head Injury      Home Medications Prior to Admission medications   Medication Sig Start Date End Date Taking? Authorizing Provider  ibuprofen (ADVIL,MOTRIN) 600 MG tablet Take 1 tablet (600 mg total) by mouth every 6 (six) hours. 11/10/15   Marlinda Mike, CNM      Allergies    Patient has no known allergies.    Review of Systems   Review of Systems  Skin:  Positive for wound.    Physical Exam Updated Vital Signs BP 127/87 (BP Location: Right Arm)   Pulse 65   Temp 98.7 F (37.1 C) (Oral)   Resp 20   Ht 5' (1.524 m)   Wt 42.6 kg   LMP 01/31/2023   SpO2 97%   BMI 18.36 kg/m  Physical Exam Vitals and nursing note reviewed.  Constitutional:      Appearance: Normal appearance.  HENT:     Head: Atraumatic.  Eyes:     Extraocular Movements: Extraocular movements intact.     Pupils: Pupils are equal, round, and reactive to light.  Pulmonary:     Effort: Pulmonary effort is normal.  Musculoskeletal:     Comments: Able to ambulate without difficulty  Skin:    Comments: Approximately 6mm laceration to the upper left forehead No foreign debris, bleeding well-controlled  Neurological:     General: No focal deficit present.     Mental Status: She is alert.  Psychiatric:        Mood and Affect:  Mood normal.        Behavior: Behavior normal.     ED Results / Procedures / Treatments   Labs (all labs ordered are listed, but only abnormal results are displayed) Labs Reviewed - No data to display  EKG None  Radiology No results found.  Procedures .Laceration Repair  Date/Time: 02/03/2023 11:33 PM  Performed by: Arabella Merles, PA-C Authorized by: Arabella Merles, PA-C   Consent:    Consent obtained:  Verbal   Consent given by:  Patient   Risks, benefits, and alternatives were discussed: yes     Risks discussed:  Infection, pain and poor cosmetic result   Alternatives discussed:  No treatment Universal protocol:    Procedure explained and questions answered to patient or proxy's satisfaction: yes     Patient identity confirmed:  Verbally with patient Anesthesia:    Anesthesia method:  Local infiltration   Local anesthetic:  Lidocaine 1% w/o epi (1ml) Laceration details:    Location:  Face   Face location:  Forehead   Length (cm):  0.5 Exploration:    Wound exploration: wound explored through full range of motion   Treatment:    Wound cleansed with: Alcohol swab.  Irrigation solution:  Sterile saline   Irrigation method:  Syringe   Debridement:  None   Undermining:  None Skin repair:    Repair method:  Sutures   Suture size:  6-0   Suture material:  Prolene   Suture technique:  Simple interrupted   Number of sutures:  1 Repair type:    Repair type:  Simple Post-procedure details:    Dressing:  Antibiotic ointment   Procedure completion:  Tolerated well, no immediate complications     Medications Ordered in ED Medications  lidocaine (PF) (XYLOCAINE) 1 % injection 5 mL (has no administration in time range)  Tdap (BOOSTRIX) injection 0.5 mL (0.5 mLs Intramuscular Given 02/03/23 2155)    ED Course/ Medical Decision Making/ A&P                                 Medical Decision Making Risk Prescription drug management.     Differential  diagnosis includes but is not limited to abrasion, laceration, intracranial hemorrhage, concussion  ED Course:  Patient well-appearing, stable vital signs.  Had metal door hit her in the head, causing a small laceration to her forehead.  Last tetanus was at least 7 years ago, unsure the exact date.  Tetanus updated here.  Laceration was repaired with 1 suture without complication. No concern for any intracranial hemorrhage or injury at this time given patient denies any loss of consciousness, no nausea or vomiting. She is not on any blood thinners. States she feels slight unsteady but attributes this to not eating all day.  She is able to ambulate without difficulty. No indication for further imaging at this time Patient stable and appropriate for discharge home this time.  Impression: Laceration to left forehead  Disposition:  The patient was discharged home with instructions to get her suture removed in 5 days.  The area clean with soap and water.  May apply antibiotic ointment over the laceration site. Return precautions given.                Final Clinical Impression(s) / ED Diagnoses Final diagnoses:  Facial laceration, initial encounter    Rx / DC Orders ED Discharge Orders     None         Arabella Merles, [place] 02/03/23 2335

## 2023-04-13 ENCOUNTER — Other Ambulatory Visit: Payer: Self-pay

## 2023-04-13 ENCOUNTER — Encounter: Payer: Self-pay | Admitting: Physician Assistant

## 2023-04-13 ENCOUNTER — Ambulatory Visit: Payer: Self-pay | Admitting: Physician Assistant

## 2023-04-13 VITALS — BP 129/83 | HR 89 | Temp 97.5°F | Ht 60.0 in | Wt 95.0 lb

## 2023-04-13 DIAGNOSIS — J029 Acute pharyngitis, unspecified: Secondary | ICD-10-CM

## 2023-04-13 LAB — POCT RAPID STREP A (OFFICE): Rapid Strep A Screen: NEGATIVE

## 2023-04-13 MED ORDER — CEPHALEXIN 500 MG PO CAPS: 500.0000 mg | ORAL_CAPSULE | Freq: Two times a day (BID) | ORAL | 0 refills | Status: AC

## 2023-04-13 NOTE — Progress Notes (Signed)
 [facility] [address] [place], [place] [address]   Office Visit Note  Patient Name: Sandy Gilmore Date of Birth [id number]  Medical Record number [id number]  Date of Service: 04/13/2023  Chief Complaint  Patient presents with   Sore Throat    Patient states her son tested positive for strpt and she would like to be tested. She woke up today not feeling well and had a temperature of 101 this morning.     HPI Pt is here for a sick visit. Son tested positive for Strep on Tuesday, and now pt woke up this morning with moderate ST and had a fever 101. Slight belly ache/nausea. No meds tried, fever seems to have self resolved. +chills. No known aggravating factors but hurts to swallow. No drooling or trismus.  Pt's son is still on abx currently.  No ear symptoms, rhinorrhea, congestion, cough, CP, SOB, vomiting, diarrhea or constipation, or other symptoms.    ROS: Review of Systems  Constitutional:  Positive for chills and fever.  HENT:  Positive for sore throat. Negative for congestion, drooling, ear discharge, ear pain and rhinorrhea.   Respiratory:  Negative for cough and shortness of breath.   Cardiovascular:  Negative for chest pain.  Gastrointestinal:  Positive for abdominal pain (mild) and nausea. Negative for constipation and vomiting.  Skin:  Negative for rash.  Allergic/Immunologic: Negative for immunocompromised state.     Current Medication:  Outpatient Encounter Medications as of 04/13/2023  Medication Sig   cephALEXin (KEFLEX) 500 MG capsule Take 1 capsule (500 mg total) by mouth 2 (two) times daily for 7 days.   ibuprofen (ADVIL,MOTRIN) 600 MG tablet Take 1 tablet (600 mg total) by mouth every 6 (six) hours.   terbinafine (LAMISIL) 250 MG tablet Take by mouth as directed.   No facility-administered encounter medications on file as of 04/13/2023.      Medical History: Past Medical History:  Diagnosis Date   Degenerative disc disease, lumbar    Status post  vacuum-assisted vaginal delivery (10/2) 11/08/2015     Vital Signs: BP 129/83   Pulse 89   Temp (!) 97.5 F (36.4 C)   Ht 5' (1.524 m)   Wt 43.1 kg   SpO2 98%   BMI 18.55 kg/m    Physical Exam Vitals and nursing note reviewed.  Constitutional:      General: She is not in acute distress.    Appearance: Normal appearance. She is well-developed. She is not toxic-appearing.     Comments: Afebrile, nontoxic, NAD  HENT:     Head: Normocephalic and atraumatic.     Nose: Nose normal.     Mouth/Throat:     Mouth: Mucous membranes are moist.     Pharynx: Uvula midline. Posterior oropharyngeal erythema present. No oropharyngeal exudate or uvula swelling.     Tonsils: No tonsillar exudate. 0 on the right. 0 on the left.     Comments: Nose clear. Oropharynx moderately injected on tonsillar pillars, without uvular swelling or deviation, no trismus or drooling, no tonsillar swelling or exudates.   Eyes:     General:        Right eye: No discharge.        Left eye: No discharge.     Conjunctiva/sclera: Conjunctivae normal.  Cardiovascular:     Rate and Rhythm: Normal rate.     Pulses: Normal pulses.  Pulmonary:     Effort: Pulmonary effort is normal. No respiratory distress.  Abdominal:  General: Bowel sounds are normal. There is no distension.     Palpations: Abdomen is soft.     Tenderness: There is no abdominal tenderness. There is no guarding or rebound.     Comments: Soft, NTND, +BS throughout, no r/g/r  Musculoskeletal:        General: Normal range of motion.     Cervical back: Normal range of motion and neck supple.  Skin:    General: Skin is warm and dry.     Findings: No rash.  Neurological:     Mental Status: She is alert and oriented to person, place, and time.     Sensory: Sensation is intact. No sensory deficit.     Motor: Motor function is intact.  Psychiatric:        Mood and Affect: Mood and affect normal.        Behavior: Behavior normal.        Assessment/Plan:   ICD-10-CM   1. Pharyngitis, unspecified etiology  J02.9 POCT rapid strep A    2. Sore throat  J02.9 POCT rapid strep A       -Pt here with ST with +exposure to strep, symptoms just started this morning, exam reveals pt is afebrile with moderate erythema but otherwise benign.   -Rapid strep negative but very early so could be too early to detect.  -Doubt need for other testing at this time.  -Highly suspect could be strep even with neg test, just too early to detect/manifest. Will rx safety script-- pt to start abx if fevers persist, or if symptoms worsen. Rx for Keflex sent. Of course if symptoms improve and no fevers occur, then she can just treat it supportively.  -Discussed OTCs for symptomatic relief and supportive care (i.e. Alternating Tylenol 1000mg  max and Ibuprofen 600-800mg  max, Chloraseptic spray/throat lozenges for sore throat, hot tea with honey/lemon for sore throat, Flonase 2 sprays each nostril twice daily and Netipot for congestion, antihistamines like Zyrtec/Claritin/Xyzal/etc, decongestants like Sudafed for congestion with caution, Mucinex D for cough/congestion or Mucinex DM for cough/expectoration, adequate hydration and rest, etc).  -F/up as needed or if not improving.  -Strict return/ED guidelines discussed.   General Counseling: idy rawling understanding of the findings of todays visit and agrees with plan of treatment. I have discussed any further diagnostic evaluation that may be needed or ordered today. We also reviewed her medications today. she has been encouraged to call the office with any questions or concerns that should arise related to todays visit.   Orders Placed This Encounter  Procedures   POCT rapid strep A   Results for orders placed or performed in visit on 04/13/23 (from the past 24 hours)  POCT rapid strep A     Status: None   Collection Time: 04/13/23 11:55 AM  Result Value Ref Range   Rapid Strep A Screen  Negative Negative     Meds ordered this encounter  Medications   cephALEXin (KEFLEX) 500 MG capsule    Sig: Take 1 capsule (500 mg total) by mouth 2 (two) times daily for 7 days.    Dispense:  14 capsule    Refill:  0    Supervising Provider:   Erasmo Downer [[id number]]    Time spent: [address] PA-C  Fish farm manager

## 2023-04-17 ENCOUNTER — Other Ambulatory Visit: Payer: Self-pay

## 2023-04-17 ENCOUNTER — Encounter: Payer: Self-pay | Admitting: Oncology

## 2023-04-17 ENCOUNTER — Ambulatory Visit: Payer: Self-pay | Admitting: Oncology

## 2023-04-17 VITALS — BP 122/80 | HR 103 | Temp 97.9°F | Ht 60.0 in | Wt 95.0 lb

## 2023-04-17 DIAGNOSIS — M545 Low back pain, unspecified: Secondary | ICD-10-CM

## 2023-04-17 NOTE — Progress Notes (Signed)
 [facility]  [address] [place], [place] [address] Phone: [phone] Fax: [phone]   Office Visit Note  Patient Name: Sandy Gilmore  Date of [id number]  Med Rec number [id number]  Date of Service: 04/17/2023  Patient has no known allergies.  Chief Complaint  Patient presents with   Back Pain    Patient c/o low back pain that began yesterday morning. Pain is worse when sitting or lying down. She has been wearing a brace with heat which has been very helpful. She feels it may be caused by lying in bed too long after being sick last week.     HPI Patient is an 43 y.o. with low back pain bilaterally that started yesterday morning.  Spent 2 days in bed over the weekend due to strep throat.  She was treated with antibiotics which she is still on.  Sore throat is better.  She took some Motrin yesterday which helped.  She has been using a heated back brace which has also been helpful.  Denies any injury or exercise.  Thinks her lower back is bothering her due to laying down for so long.  Reports body aches that lasted all weekend.  States she would prefer to stand then sit due to comfort.  Denies previous history of low back pain.  No urinary concerns.  No history of kidney stones.  No fever since Sunday.  She is urinating normal.   Current Medication:  Outpatient Encounter Medications as of 04/17/2023  Medication Sig   cephALEXin (KEFLEX) 500 MG capsule Take 1 capsule (500 mg total) by mouth 2 (two) times daily for 7 days.   ibuprofen (ADVIL,MOTRIN) 600 MG tablet Take 1 tablet (600 mg total) by mouth every 6 (six) hours.   terbinafine (LAMISIL) 250 MG tablet Take by mouth as directed.   No facility-administered encounter medications on file as of 04/17/2023.      Medical History: Past Medical History:  Diagnosis Date   Degenerative disc disease, lumbar    Status post vacuum-assisted vaginal delivery (10/2) 11/08/2015    Vital Signs: BP 122/80 Comment: taken while  standing per pateint request d/t low back pain  Pulse (!) 103   Temp 97.9 F (36.6 C)   Ht 5' (1.524 m)   Wt 95 lb (43.1 kg)   SpO2 99%   BMI 18.55 kg/m   ROS: As per HPI.  All other pertinent ROS negative.     Review of Systems  Musculoskeletal:  Positive for back pain and myalgias.    Physical Exam Musculoskeletal:     Lumbar back: Tenderness present.     Comments: Tenderness to lumbar region.  Upon palpation, pinpoint tenderness at gluteus medius bilaterally and iliac crest.  No spinal tenderness.     No results found for this or any previous visit (from the past 24 hours).  Assessment/Plan: 1. Acute bilateral low back pain without sciatica (Primary) We discussed this is likely due to laying in bed for 48 hours due to illness but given she is unable to sit for long periods of time, would like to obtain lab work including CBC and CMP to rule out obstructing kidney stone versus rhabdo.  We discussed rotating Tylenol and ibuprofen/Motrin every few hours and continuing to use her brace and heat for comfort.  If no improvement over the next 2 to 3 days, please let me know.  Will call with results.  - CBC with Differential/Platelet - Comprehensive metabolic panel   General  Counseling: ahlivia salahuddin understanding of the findings of todays visit and agrees with plan of treatment. I have discussed any further diagnostic evaluation that may be needed or ordered today. We also reviewed her medications today. she has been encouraged to call the office with any questions or concerns that should arise related to todays visit.   Orders Placed This Encounter  Procedures   CBC with Differential/Platelet   Comprehensive metabolic panel    No orders of the defined types were placed in this encounter.   I spent 25 minutes dedicated to the care of this patient (face-to-face and non-face-to-face) on the date of the encounter to include what is described in the assessment and  plan.   Durenda Hurt, NP 04/17/2023 10:32 AM

## 2023-04-18 ENCOUNTER — Encounter: Payer: Self-pay | Admitting: Oncology

## 2023-04-18 LAB — CBC WITH DIFFERENTIAL/PLATELET
Basophils Absolute: 0 10*3/uL (ref 0.0–0.2)
Basos: 0 %
EOS (ABSOLUTE): 0 10*3/uL (ref 0.0–0.4)
Eos: 0 %
Hematocrit: 47.1 % — ABNORMAL HIGH (ref 34.0–46.6)
Hemoglobin: 15.6 g/dL (ref 11.1–15.9)
Immature Grans (Abs): 0 10*3/uL (ref 0.0–0.1)
Immature Granulocytes: 0 %
Lymphocytes Absolute: 1.1 10*3/uL (ref 0.7–3.1)
Lymphs: 35 %
MCH: 31.9 pg (ref 26.6–33.0)
MCHC: 33.1 g/dL (ref 31.5–35.7)
MCV: 96 fL (ref 79–97)
Monocytes Absolute: 0.5 10*3/uL (ref 0.1–0.9)
Monocytes: 16 %
Neutrophils Absolute: 1.5 10*3/uL (ref 1.4–7.0)
Neutrophils: 49 %
Platelets: 244 10*3/uL (ref 150–450)
RBC: 4.89 x10E6/uL (ref 3.77–5.28)
RDW: 12 % (ref 11.7–15.4)
WBC: 3.1 10*3/uL — ABNORMAL LOW (ref 3.4–10.8)

## 2023-04-18 LAB — COMPREHENSIVE METABOLIC PANEL
ALT: 13 IU/L (ref 0–32)
AST: 20 IU/L (ref 0–40)
Albumin: 4.3 g/dL (ref 3.9–4.9)
Alkaline Phosphatase: 48 IU/L (ref 44–121)
BUN/Creatinine Ratio: 17 (ref 9–23)
BUN: 11 mg/dL (ref 6–24)
Bilirubin Total: 0.2 mg/dL (ref 0.0–1.2)
CO2: 24 mmol/L (ref 20–29)
Calcium: 9.6 mg/dL (ref 8.7–10.2)
Chloride: 101 mmol/L (ref 96–106)
Creatinine, Ser: 0.65 mg/dL (ref 0.57–1.00)
Globulin, Total: 2.8 g/dL (ref 1.5–4.5)
Glucose: 108 mg/dL — ABNORMAL HIGH (ref 70–99)
Potassium: 3.7 mmol/L (ref 3.5–5.2)
Sodium: 142 mmol/L (ref 134–144)
Total Protein: 7.1 g/dL (ref 6.0–8.5)
eGFR: 111 mL/min/{1.73_m2} (ref >59)

## 2024-02-13 ENCOUNTER — Ambulatory Visit: Payer: Self-pay | Admitting: Adult Health

## 2024-02-13 ENCOUNTER — Encounter: Payer: Self-pay | Admitting: Adult Health

## 2024-02-13 ENCOUNTER — Other Ambulatory Visit: Payer: Self-pay

## 2024-02-13 VITALS — BP 114/80 | HR 96 | Temp 99.1°F | Ht 60.0 in | Wt 94.0 lb

## 2024-02-13 DIAGNOSIS — R519 Headache, unspecified: Secondary | ICD-10-CM

## 2024-02-13 DIAGNOSIS — J101 Influenza due to other identified influenza virus with other respiratory manifestations: Secondary | ICD-10-CM

## 2024-02-13 DIAGNOSIS — R509 Fever, unspecified: Secondary | ICD-10-CM

## 2024-02-13 DIAGNOSIS — R52 Pain, unspecified: Secondary | ICD-10-CM

## 2024-02-13 LAB — POC SOFIA 2 FLU + SARS ANTIGEN FIA
Influenza A, POC: NEGATIVE
Influenza B, POC: POSITIVE — AB
SARS Coronavirus 2 Ag: NEGATIVE

## 2024-02-13 MED ORDER — ALBUTEROL SULFATE HFA 108 (90 BASE) MCG/ACT IN AERS: 2.0000 | INHALATION_SPRAY | Freq: Four times a day (QID) | RESPIRATORY_TRACT | 0 refills | Status: AC | PRN

## 2024-02-13 NOTE — Progress Notes (Signed)
 Therapist, [facility] [address] [place], [place] [address]   Office Visit Note  Patient Name: Sandy Gilmore Date of Birth [id number]  Medical Record number [id number]  Date of Service: 02/14/2024  Chief Complaint  Patient presents with   Acute Visit    Patient c/o body aches, headache, and productive cough. Cough is worse at night and has been more dry recently. Symptoms began over the weekend. She had a temperature of 101.8 for 2 days which has since subsided. She states she has been vaccinated for the flu this season.     HPI Pt is here for a sick visit. She reports she had fever over the weekend.  It resolved 2 days ago.  However, she continues to have cough, headache, body aches. Then last night, the cough was worse.  Son had fever and similar symptoms last week.    Current Medication:  Outpatient Encounter Medications as of 02/13/2024  Medication Sig   albuterol  (VENTOLIN  HFA) 108 (90 Base) MCG/ACT inhaler Inhale 2 puffs into the lungs every 6 (six) hours as needed for wheezing or shortness of breath.   ibuprofen  (ADVIL ,MOTRIN ) 600 MG tablet Take 1 tablet (600 mg total) by mouth every 6 (six) hours.   [DISCONTINUED] terbinafine (LAMISIL) 250 MG tablet Take by mouth as directed.   No facility-administered encounter medications on file as of 02/13/2024.      Medical History: Past Medical History:  Diagnosis Date   Degenerative disc disease, lumbar    Status post vacuum-assisted vaginal delivery (10/2) 11/08/2015     Vital Signs: BP 114/80   Pulse 96   Temp 99.1 F (37.3 C)   Ht 5' (1.524 m)   Wt 94 lb (42.6 kg)   SpO2 99%   BMI 18.36 kg/m    Review of Systems  Constitutional:  Positive for fever.  Respiratory:  Positive for cough.   Musculoskeletal:  Positive for myalgias.  Neurological:  Positive for headaches.    Physical Exam Vitals reviewed.  Constitutional:      General: She is not in acute distress.    Appearance: Normal appearance. She is normal weight.  She is not ill-appearing.  HENT:     Head: Normocephalic.     Right Ear: Tympanic membrane and ear canal normal.     Left Ear: Tympanic membrane and ear canal normal.     Nose: Nose normal.     Mouth/Throat:     Mouth: Mucous membranes are moist.  Eyes:     General:        Right eye: No discharge.        Left eye: No discharge.     Pupils: Pupils are equal, round, and reactive to light.  Cardiovascular:     Rate and Rhythm: Normal rate and regular rhythm.     Pulses: Normal pulses.     Heart sounds: No murmur heard.    No friction rub.  Pulmonary:     Effort: Pulmonary effort is normal. No respiratory distress.     Breath sounds: Normal breath sounds. No wheezing.  Musculoskeletal:     Cervical back: Normal range of motion. No rigidity.  Lymphadenopathy:     Cervical: No cervical adenopathy.  Neurological:     General: No focal deficit present.     Mental Status: She is alert and oriented to person, place, and time.  Psychiatric:        Mood and Affect: Mood normal.  Behavior: Behavior normal.      Results for orders placed or performed in visit on 02/13/24 (from the past 24 hours)  POC SOFIA 2 FLU + SARS ANTIGEN FIA     Status: Abnormal   Collection Time: 02/13/24  3:36 PM  Result Value Ref Range   Influenza A, POC Negative Negative   Influenza B, POC Positive (A) Negative   SARS Coronavirus 2 Ag Negative Negative    Assessment/Plan: 1. Influenza B Pt out of window for Tamiflu. Encourage supportive measures (fluids, rest cough drops, tea) and over the counter meds (I.e. analgesic/anti-inflammatory, decongestant, antihistamine, cough suppressant)  as needed for symptom relief.  Discussed infection control measures to prevent spread of infection.  Advised she should not return to class until afebrile (100.4 or above) for 24 hours.  Advised patient to send me a message in MyChart if continue to have fever.  Return to clinic as needed for new/worsening symptoms (I.e.  shortness of breath) or if symptoms are not resolving as expected over the next 5-7 days.    2. Fever, unspecified fever cause (Primary) Discussed OTC meds to help with symptoms.  - POC SOFIA 2 FLU + SARS ANTIGEN FIA  3. Acute nonintractable headache, unspecified headache type - POC SOFIA 2 FLU + SARS ANTIGEN FIA  4. Body aches - POC SOFIA 2 FLU + SARS ANTIGEN FIA     General Counseling: [name] verbalizes understanding of the findings of todays visit and agrees with plan of treatment. I have discussed any further diagnostic evaluation that may be needed or ordered today. We also reviewed her medications today. she has been encouraged to call the office with any questions or concerns that should arise related to todays visit.   Orders Placed This Encounter  Procedures   POC SOFIA 2 FLU + SARS ANTIGEN FIA    Meds ordered this encounter  Medications   albuterol  (VENTOLIN  HFA) 108 (90 Base) MCG/ACT inhaler    Sig: Inhale 2 puffs into the lungs every 6 (six) hours as needed for wheezing or shortness of breath.    Dispense:  8.5 g    Refill:  0    Time spent:15 Minutes    Sandy Gilmore AGNP-C Nurse Practitioner
